# Patient Record
Sex: Female | Born: 1945 | Race: White | Hispanic: No | Marital: Married | Smoking: Former smoker
Health system: Southern US, Community
[De-identification: ages and names within clinical notes are randomized; demographics above are authoritative.]

## PROBLEM LIST (undated history)

## (undated) DIAGNOSIS — F419 Anxiety disorder, unspecified: Secondary | ICD-10-CM

## (undated) DIAGNOSIS — Z87898 Personal history of other specified conditions: Secondary | ICD-10-CM

## (undated) DIAGNOSIS — I10 Essential (primary) hypertension: Secondary | ICD-10-CM

## (undated) DIAGNOSIS — K589 Irritable bowel syndrome without diarrhea: Secondary | ICD-10-CM

## (undated) DIAGNOSIS — R351 Nocturia: Secondary | ICD-10-CM

## (undated) DIAGNOSIS — M1990 Unspecified osteoarthritis, unspecified site: Secondary | ICD-10-CM

## (undated) DIAGNOSIS — Z8719 Personal history of other diseases of the digestive system: Secondary | ICD-10-CM

## (undated) DIAGNOSIS — F32A Depression, unspecified: Secondary | ICD-10-CM

## (undated) DIAGNOSIS — Z8679 Personal history of other diseases of the circulatory system: Secondary | ICD-10-CM

## (undated) DIAGNOSIS — M545 Low back pain, unspecified: Secondary | ICD-10-CM

## (undated) DIAGNOSIS — R251 Tremor, unspecified: Secondary | ICD-10-CM

## (undated) DIAGNOSIS — E039 Hypothyroidism, unspecified: Secondary | ICD-10-CM

## (undated) DIAGNOSIS — K219 Gastro-esophageal reflux disease without esophagitis: Secondary | ICD-10-CM

## (undated) DIAGNOSIS — F411 Generalized anxiety disorder: Secondary | ICD-10-CM

## (undated) DIAGNOSIS — M797 Fibromyalgia: Secondary | ICD-10-CM

## (undated) DIAGNOSIS — IMO0002 Reserved for concepts with insufficient information to code with codable children: Secondary | ICD-10-CM

## (undated) DIAGNOSIS — G250 Essential tremor: Secondary | ICD-10-CM

## (undated) DIAGNOSIS — T8859XA Other complications of anesthesia, initial encounter: Secondary | ICD-10-CM

## (undated) DIAGNOSIS — G8929 Other chronic pain: Secondary | ICD-10-CM

## (undated) DIAGNOSIS — E785 Hyperlipidemia, unspecified: Secondary | ICD-10-CM

## (undated) DIAGNOSIS — E559 Vitamin D deficiency, unspecified: Secondary | ICD-10-CM

## (undated) DIAGNOSIS — G4700 Insomnia, unspecified: Secondary | ICD-10-CM

## (undated) DIAGNOSIS — R350 Frequency of micturition: Secondary | ICD-10-CM

## (undated) DIAGNOSIS — F329 Major depressive disorder, single episode, unspecified: Secondary | ICD-10-CM

## (undated) DIAGNOSIS — E041 Nontoxic single thyroid nodule: Secondary | ICD-10-CM

## (undated) DIAGNOSIS — T4145XA Adverse effect of unspecified anesthetic, initial encounter: Secondary | ICD-10-CM

## (undated) HISTORY — DX: Tremor, unspecified: R25.1

## (undated) HISTORY — DX: Irritable bowel syndrome, unspecified: K58.9

## (undated) HISTORY — DX: Low back pain, unspecified: M54.50

## (undated) HISTORY — DX: Generalized anxiety disorder: F41.1

## (undated) HISTORY — PX: HEMORROIDECTOMY: SUR656

## (undated) HISTORY — PX: OTHER SURGICAL HISTORY: SHX169

## (undated) HISTORY — PX: CARDIOVASCULAR STRESS TEST: SHX262

## (undated) HISTORY — DX: Hyperlipidemia, unspecified: E78.5

## (undated) HISTORY — DX: Essential (primary) hypertension: I10

## (undated) HISTORY — DX: Personal history of other specified conditions: Z87.898

## (undated) HISTORY — DX: Major depressive disorder, single episode, unspecified: F32.9

## (undated) HISTORY — DX: Reserved for concepts with insufficient information to code with codable children: IMO0002

## (undated) HISTORY — PX: VAGINAL HYSTERECTOMY: SUR661

## (undated) HISTORY — DX: Gastro-esophageal reflux disease without esophagitis: K21.9

## (undated) HISTORY — DX: Unspecified osteoarthritis, unspecified site: M19.90

## (undated) HISTORY — DX: Other chronic pain: G89.29

## (undated) HISTORY — DX: Anxiety disorder, unspecified: F41.9

## (undated) HISTORY — DX: Depression, unspecified: F32.A

## (undated) HISTORY — DX: Hypothyroidism, unspecified: E03.9

## (undated) HISTORY — PX: CARDIAC CATHETERIZATION: SHX172

## (undated) HISTORY — DX: Vitamin D deficiency, unspecified: E55.9

## (undated) HISTORY — PX: TONSILLECTOMY AND ADENOIDECTOMY: SUR1326

---

## 1991-05-23 HISTORY — PX: SIGMOID RESECTION / RECTOPEXY: SUR1294

## 1998-01-26 ENCOUNTER — Ambulatory Visit (HOSPITAL_COMMUNITY): Admission: RE | Admit: 1998-01-26 | Discharge: 1998-01-26 | Payer: Self-pay | Admitting: Gastroenterology

## 1998-04-09 ENCOUNTER — Encounter: Payer: Self-pay | Admitting: Urology

## 1998-04-13 ENCOUNTER — Ambulatory Visit (HOSPITAL_COMMUNITY): Admission: RE | Admit: 1998-04-13 | Discharge: 1998-04-13 | Payer: Self-pay | Admitting: Urology

## 2000-06-27 ENCOUNTER — Encounter: Admission: RE | Admit: 2000-06-27 | Discharge: 2000-06-27 | Payer: Self-pay | Admitting: Family Medicine

## 2000-06-27 ENCOUNTER — Encounter: Payer: Self-pay | Admitting: Family Medicine

## 2000-07-25 ENCOUNTER — Encounter: Admission: RE | Admit: 2000-07-25 | Discharge: 2000-07-25 | Payer: Self-pay | Admitting: Family Medicine

## 2000-07-25 ENCOUNTER — Encounter: Payer: Self-pay | Admitting: Family Medicine

## 2000-10-13 ENCOUNTER — Encounter: Payer: Self-pay | Admitting: Emergency Medicine

## 2000-10-13 ENCOUNTER — Inpatient Hospital Stay (HOSPITAL_COMMUNITY): Admission: EM | Admit: 2000-10-13 | Discharge: 2000-10-15 | Payer: Self-pay | Admitting: Emergency Medicine

## 2000-10-13 ENCOUNTER — Encounter: Payer: Self-pay | Admitting: General Surgery

## 2001-01-28 ENCOUNTER — Encounter: Payer: Self-pay | Admitting: Emergency Medicine

## 2001-01-29 ENCOUNTER — Inpatient Hospital Stay (HOSPITAL_COMMUNITY): Admission: EM | Admit: 2001-01-29 | Discharge: 2001-01-31 | Payer: Self-pay | Admitting: Emergency Medicine

## 2001-01-29 ENCOUNTER — Encounter: Payer: Self-pay | Admitting: Family Medicine

## 2002-02-11 ENCOUNTER — Encounter: Admission: RE | Admit: 2002-02-11 | Discharge: 2002-02-11 | Payer: Self-pay | Admitting: Family Medicine

## 2002-02-11 ENCOUNTER — Encounter: Payer: Self-pay | Admitting: Family Medicine

## 2002-05-22 HISTORY — PX: CHOLECYSTECTOMY: SHX55

## 2002-05-22 HISTORY — PX: BILATERAL SALPINGOOPHORECTOMY: SHX1223

## 2002-09-23 ENCOUNTER — Encounter: Payer: Self-pay | Admitting: Urology

## 2002-09-25 ENCOUNTER — Ambulatory Visit (HOSPITAL_COMMUNITY): Admission: RE | Admit: 2002-09-25 | Discharge: 2002-09-25 | Payer: Self-pay | Admitting: Urology

## 2004-02-17 ENCOUNTER — Ambulatory Visit: Payer: Self-pay | Admitting: Internal Medicine

## 2004-06-29 ENCOUNTER — Emergency Department (HOSPITAL_COMMUNITY): Admission: EM | Admit: 2004-06-29 | Discharge: 2004-06-29 | Payer: Self-pay | Admitting: Emergency Medicine

## 2004-10-04 ENCOUNTER — Emergency Department (HOSPITAL_COMMUNITY): Admission: EM | Admit: 2004-10-04 | Discharge: 2004-10-04 | Payer: Self-pay | Admitting: Emergency Medicine

## 2004-10-05 ENCOUNTER — Ambulatory Visit: Payer: Self-pay | Admitting: Psychiatry

## 2004-10-05 ENCOUNTER — Inpatient Hospital Stay (HOSPITAL_COMMUNITY): Admission: EM | Admit: 2004-10-05 | Discharge: 2004-10-07 | Payer: Self-pay | Admitting: Psychiatry

## 2010-06-11 ENCOUNTER — Encounter: Payer: Self-pay | Admitting: Family Medicine

## 2010-06-12 ENCOUNTER — Encounter: Payer: Self-pay | Admitting: Family Medicine

## 2010-10-05 HISTORY — PX: ESOPHAGOGASTRODUODENOSCOPY: SHX1529

## 2010-10-20 ENCOUNTER — Telehealth: Payer: Self-pay | Admitting: Cardiology

## 2010-10-20 ENCOUNTER — Encounter: Payer: Self-pay | Admitting: Cardiology

## 2010-10-21 ENCOUNTER — Encounter: Payer: Self-pay | Admitting: Cardiology

## 2010-10-21 ENCOUNTER — Ambulatory Visit (INDEPENDENT_AMBULATORY_CARE_PROVIDER_SITE_OTHER): Payer: Managed Care, Other (non HMO) | Admitting: Cardiology

## 2010-10-21 ENCOUNTER — Ambulatory Visit: Payer: Self-pay | Admitting: Cardiology

## 2010-10-21 DIAGNOSIS — E785 Hyperlipidemia, unspecified: Secondary | ICD-10-CM | POA: Insufficient documentation

## 2010-10-21 DIAGNOSIS — K219 Gastro-esophageal reflux disease without esophagitis: Secondary | ICD-10-CM

## 2010-10-21 DIAGNOSIS — I739 Peripheral vascular disease, unspecified: Secondary | ICD-10-CM

## 2010-10-21 DIAGNOSIS — Z720 Tobacco use: Secondary | ICD-10-CM

## 2010-10-21 DIAGNOSIS — R079 Chest pain, unspecified: Secondary | ICD-10-CM | POA: Insufficient documentation

## 2010-10-21 DIAGNOSIS — I509 Heart failure, unspecified: Secondary | ICD-10-CM

## 2010-10-21 DIAGNOSIS — I10 Essential (primary) hypertension: Secondary | ICD-10-CM | POA: Insufficient documentation

## 2010-10-21 NOTE — Progress Notes (Signed)
Julie Ballard Date of Birth: 16-Nov-1945   History of Present Illness: Julie Ballard is a pleasant 65 year old white female who is seen at the request of Dr. Andrey Campanile for evaluation of epigastric pain. She complains primarily of pain in the epigastric and lower sternal region. This may be accompanied with a knifelike sensation underneath her left breast that radiates into her back. Sometimes she gets very sick with this and feels like she's going to pass out. At other times she has a pulling sensation that radiates into her right abdomen down to her right groin. If she is in a sitting position she has to straighten up for the symptoms to resolve. If she is standing she states the symptoms are so bad that she almost has to go down on her knees. She has had cardiac evaluation in the distant past. In 1990 she had several stress tests which were unremarkable. She did have a heart catheterization in 2001 at [facility] and reports that her coronaries were normal. She also apparently had congestive heart failure in early 2000 and a time when her blood pressure was severely elevated. Her echocardiogram was apparently unremarkable. She does admit that her blood pressure hasn't been optimal control. Currently she is only on in Inderal and HCTZ for this.  Current Outpatient Prescriptions on File Prior to Visit  Medication Sig Dispense Refill  . aspirin 81 MG tablet Take 81 mg by mouth daily.        Marland Kitchen BLACK COHOSH PO Take by mouth daily.        . chlorpheniramine (CHLOR-TRIMETON) 4 MG tablet Take 4 mg by mouth as needed.        . cyclobenzaprine (FLEXERIL) 10 MG tablet Take 10 mg by mouth 3 (three) times daily as needed.        . dicyclomine (BENTYL) 10 MG capsule Take 10 mg by mouth 4 (four) times daily -  before meals and at bedtime.        . ergocalciferol (VITAMIN D2) 50000 UNITS capsule Take 50,000 Units by mouth once a week.        . estradiol (ESTRACE) 1 MG tablet Take 1 mg by mouth daily.        .  hydrochlorothiazide 25 MG tablet Take 25 mg by mouth daily.        Marland Kitchen ibuprofen (ADVIL,MOTRIN) 200 MG tablet Take 600 mg by mouth as needed.        Marland Kitchen LORazepam (ATIVAN) 1 MG tablet Take 1 mg by mouth every 8 (eight) hours.        . niacin 500 MG tablet Take 500 mg by mouth daily with breakfast.        . omeprazole (PRILOSEC) 40 MG capsule Take 20 mg by mouth daily.       . phenylephrine (SUDAFED PE) 10 MG TABS Take 10 mg by mouth as needed.        . potassium chloride (KLOR-CON) 10 MEQ CR tablet Take 20 mEq by mouth daily.       . pravastatin (PRAVACHOL) 80 MG tablet Take 80 mg by mouth daily.        . promethazine (PHENERGAN) 25 MG tablet Take 25 mg by mouth every 6 (six) hours as needed.        . propranolol (INDERAL) 40 MG tablet Take 40 mg by mouth 3 (three) times daily.        Marland Kitchen pyridOXINE (VITAMIN B-6) 100 MG tablet Take 100 mg by mouth daily.        Marland Kitchen  vitamin B-12 (CYANOCOBALAMIN) 1000 MCG tablet Take 1,000 mcg by mouth daily.        Marland Kitchen zolpidem (AMBIEN) 10 MG tablet Take 10 mg by mouth at bedtime as needed.        . meperidine (DEMEROL) 50 MG tablet Take 50 mg by mouth every 4 (four) hours as needed.          Allergies  Allergen Reactions  . Codeine   . Cymbalta (Duloxetine Hcl)   . Doxycycline   . Elavil (Amitriptyline Hcl)   . Erythromycin   . Morphine And Related   . Mysoline (Primidone)   . Penicillins   . Sulfa Drugs Cross Reactors   . Zofran     Past Medical History  Diagnosis Date  . DJD (degenerative joint disease)   . GERD (gastroesophageal reflux disease)   . IBS (irritable bowel syndrome)   . History of fibromyalgia     SEVERE  . Depression   . Hypertension   . Syncope and collapse   . DDD (degenerative disc disease)   . Tremor     SEVERE  . CHF (congestive heart failure)   . Hyperlipidemia   . Diverticulitis, colon     Past Surgical History  Procedure Date  . Esophagogastroduodenoscopy 10/05/10    NORMAL  . Colonoscopy   . Cardiac catheterization  09/2000  . Transthoracic echocardiogram   . Cholecystectomy   . Sigmoid resection / rectopexy   . Hemorroidectomy   . Ovary surgery   . Tonsillectomy and adenoidectomy     History  Smoking status  . Current Everyday Smoker -- 0.5 packs/day for 18 years  . Types: Cigarettes  Smokeless tobacco  . Never Used    History  Alcohol Use No    Family History  Problem Relation Age of Onset  . Heart attack Father   . Alcohol abuse Sister   . Cirrhosis Sister   . Heart attack Brother     Review of Systems: The review of systems is positive for numbness in her hands and feet.  Her toes and feet turn blue at times. She continues to smoke one half pack per day.she has had recent extensive GI evaluation by Dr. Chales Abrahams including upper and lower endoscopy with biopsies and CT of the abdomen.All other systems were reviewed and are negative.  Physical Exam: BP 160/88  Pulse 64  Ht 5\' 7"  (1.702 m)  Wt 184 lb 6 oz (83.632 kg)  BMI 28.88 kg/m2 She is a pleasant overweight white female in no acute distress. She is normocephalic, atraumatic. Pupils are equal round and reactive to light and accommodation. Sclera are clear. Oropharynx is clear. Neck is supple without JVD, adenopathy, thyromegaly, or bruits. Lungs are clear. Cardiac exam reveals a regular rate and rhythm without gallop, murmur, or click. Abdomen is soft and nontender. There is no mass or hepatosplenomegaly. Bowel sounds are positive. Femoral and popliteal pulses and posterior tibial pulses are 2+ and symmetric. Dorsalis pedis pulses are reduced. Skin is warm and dry. She is alert and oriented x3. Cranial nerves II through XII are intact. LABORATORY DATA: ECG demonstrates normal sinus rhythm with a normal ECG.  Assessment / Plan:

## 2010-10-21 NOTE — Assessment & Plan Note (Addendum)
Her blood pressure is not optimally controlled. Depending on the result of her other cardiac evaluation it may be worthwhile considering an alternative antihypertensive drugs besides Inderal. I think carvedilol or Bystolic would be more efficacious.

## 2010-10-21 NOTE — Assessment & Plan Note (Signed)
Her symptoms are atypical for cardiac pain . However she does have multiple cardiac risk factors. We will schedule her for a nuclear stress test. We will also schedule her for lower extremity arterial Doppler studies to rule out peripheral arterial disease.

## 2010-10-21 NOTE — Assessment & Plan Note (Signed)
I have encouraged her to quit smoking. She is interested in stopping but hasn't picked a quit date yet.

## 2010-10-21 NOTE — Assessment & Plan Note (Signed)
Her prior history is suggestive of hypertensive urgency. She has had no congestive heart failure since that initial incident. Her blood pressure was severely elevated at that time.

## 2010-10-21 NOTE — Patient Instructions (Signed)
We will schedule you for a nuclear stress test and lower extremity arterial dopplers.  I would encourage your efforts at smoking cessation.  We could consider an alternative beta blocker for better blood pressure control.

## 2010-10-24 ENCOUNTER — Other Ambulatory Visit: Payer: Self-pay | Admitting: Cardiology

## 2010-11-01 ENCOUNTER — Encounter: Payer: Managed Care, Other (non HMO) | Admitting: *Deleted

## 2010-11-01 ENCOUNTER — Other Ambulatory Visit (HOSPITAL_COMMUNITY): Payer: Managed Care, Other (non HMO) | Admitting: Radiology

## 2010-11-22 ENCOUNTER — Other Ambulatory Visit (HOSPITAL_COMMUNITY): Payer: Managed Care, Other (non HMO) | Admitting: Radiology

## 2010-11-22 ENCOUNTER — Encounter: Payer: Managed Care, Other (non HMO) | Admitting: *Deleted

## 2010-12-13 ENCOUNTER — Encounter: Payer: Managed Care, Other (non HMO) | Admitting: *Deleted

## 2010-12-13 ENCOUNTER — Other Ambulatory Visit (HOSPITAL_COMMUNITY): Payer: Managed Care, Other (non HMO) | Admitting: Radiology

## 2010-12-22 ENCOUNTER — Encounter (INDEPENDENT_AMBULATORY_CARE_PROVIDER_SITE_OTHER): Payer: Managed Care, Other (non HMO) | Admitting: *Deleted

## 2010-12-22 ENCOUNTER — Ambulatory Visit (HOSPITAL_COMMUNITY): Payer: Managed Care, Other (non HMO) | Attending: Cardiology | Admitting: Radiology

## 2010-12-22 DIAGNOSIS — R55 Syncope and collapse: Secondary | ICD-10-CM

## 2010-12-22 DIAGNOSIS — I739 Peripheral vascular disease, unspecified: Secondary | ICD-10-CM

## 2010-12-22 DIAGNOSIS — R079 Chest pain, unspecified: Secondary | ICD-10-CM | POA: Insufficient documentation

## 2010-12-22 DIAGNOSIS — R0789 Other chest pain: Secondary | ICD-10-CM

## 2010-12-22 MED ORDER — TECHNETIUM TC 99M TETROFOSMIN IV KIT
33.0000 | PACK | Freq: Once | INTRAVENOUS | Status: AC | PRN
  Administered 2010-12-22: 33 via INTRAVENOUS

## 2010-12-22 MED ORDER — TECHNETIUM TC 99M TETROFOSMIN IV KIT
10.5000 | PACK | Freq: Once | INTRAVENOUS | Status: AC | PRN
  Administered 2010-12-22: 11 via INTRAVENOUS

## 2010-12-22 MED ORDER — REGADENOSON 0.4 MG/5ML IV SOLN
0.4000 mg | Freq: Once | INTRAVENOUS | Status: AC
  Administered 2010-12-22: 0.4 mg via INTRAVENOUS

## 2010-12-22 NOTE — Progress Notes (Signed)
[facility] SITE 3 NUCLEAR MED [address] [place] [address] [phone]  Cardiology Nuclear Med Study  Julie Ballard is a 65 y.o. female [id number] 10-17-1945   Nuclear Med Background Indication for Stress Test:  Evaluation for Ischemia History:  '90 MPS:OK per patient; '02 Cath [facility]):OK per patient; h/o CHF Cardiac Risk Factors: Claudication, Family History - CAD, Hypertension, Lipids and Smoker  Symptoms:  Chest Pain/Tightness with and without Exertion (last episode of chest discomfort was yesterday), Diaphoresis, Dizziness, Nausea/Vomiting, Near Syncope with Chest Pain and Palpitations/Rapid HR    Nuclear Pre-Procedure Caffeine/Decaff Intake:  None NPO After: 10:00pm   Lungs:  Clear.  O2 sat 99% on RA. IV 0.9% NS with Angio Cath:  20g  IV Site: R Wrist  IV Started by:  Stanton Kidney, EMT-P  Chest Size (in):  40 Cup Size: D  Height: 5\' 7"  (1.702 m)  Weight:  179 lb (81.194 kg)  BMI:  Body mass index is 28.04 kg/(m^2). Tech Comments:  Inderal held today, per patient.    Nuclear Med Study 1 or 2 day study: 1 day  Stress Test Type:  Treadmill/Lexiscan  Reading MD: Olga Millers, MD  Order Authorizing Provider:  Peter Swaziland, MD  Resting Radionuclide: Technetium 103m Tetrofosmin  Resting Radionuclide Dose: 10.5 mCi   Stress Radionuclide:  Technetium 56m Tetrofosmin  Stress Radionuclide Dose: 33.0 mCi           Stress Protocol Rest HR: 64 Stress HR: 103  Rest BP: 134/91 Stress BP: 183/102  Exercise Time (min): 2:00 METS: n/a   Predicted Max HR: 157 bpm % Max HR: 65.61 bpm Rate Pressure Product: 21308   Dose of Adenosine (mg):  n/a Dose of Lexiscan: 0.4 mg  Dose of Atropine (mg): n/a Dose of Dobutamine: n/a mcg/kg/min (at max HR)  Stress Test Technologist: Smiley Houseman, CMA-N  Nuclear Technologist:  Doyne Keel, CNMT     Rest Procedure:  Myocardial perfusion imaging was performed at rest 45 minutes following the intravenous  administration of Technetium 31m Tetrofosmin.  Rest ECG: No acute changes.  Stress Procedure:  The patient received IV Lexiscan 0.4 mg over 15-seconds while walking low level on the treadmill.  Technetium 54m Tetrofosmin injected at 30-seconds while the patient continued to walk for one minute.  There were no significant changes with Lexiscan, rare PAC.  There was a mild hypertensive response to infusion, 183/102.  Quantitative spect images were obtained after a 45 minute delay.  Stress ECG: No significant change from baseline ECG  QPS Raw Data Images:  Acquisition technically good; normal left ventricular size. Stress Images:  Normal homogeneous uptake in all areas of the myocardium. Rest Images:  Normal homogeneous uptake in all areas of the myocardium. Subtraction (SDS):  No evidence of ischemia. Transient Ischemic Dilatation (Normal <1.22):  0.97 Lung/Heart Ratio (Normal <0.45):  0.34  Quantitative Gated Spect Images QGS EDV:  49 ml QGS ESV:  9 ml QGS cine images:  NL LV Function; NL Wall Motion QGS EF: 81%  Impression Exercise Capacity:  Lexiscan with low level exercise. BP Response:  Normal blood pressure response. Clinical Symptoms:  No chest pain. ECG Impression:  No significant ST segment change suggestive of ischemia. Comparison with Prior Nuclear Study: No images to compare  Overall Impression:  Normal stress nuclear study.  Olga Millers

## 2010-12-23 ENCOUNTER — Encounter: Payer: Self-pay | Admitting: Cardiology

## 2010-12-23 NOTE — Progress Notes (Signed)
Nuclear stress is normal with normal EF. Please report. Julie Ballard

## 2010-12-23 NOTE — Progress Notes (Signed)
Lm w/ results of stress test. Will send to Dr. Benedetto Goad

## 2010-12-23 NOTE — Progress Notes (Signed)
Nuclear report routed to Dr. Swaziland. Julie Ballard, Julie Ballard

## 2010-12-26 ENCOUNTER — Encounter: Payer: Self-pay | Admitting: Cardiology

## 2010-12-27 ENCOUNTER — Telehealth: Payer: Self-pay | Admitting: *Deleted

## 2010-12-27 NOTE — Telephone Encounter (Signed)
Message copied by Lorayne Bender on Tue Dec 27, 2010  9:50 AM ------      Message from: Swaziland, PETER M      Created: Mon Dec 26, 2010 12:36 PM       Lower extremity arterial dopplers are normal.

## 2010-12-27 NOTE — Telephone Encounter (Signed)
Lm w/doppler results. Will send copy to Dr. Benedetto Goad.

## 2010-12-27 NOTE — Telephone Encounter (Signed)
Message copied by Lorayne Bender on Tue Dec 27, 2010  9:48 AM ------      Message from: Sw[place], PETER M      Created: Mon Dec 26, 2010 12:36 PM       Lower extremity arterial dopplers are normal.

## 2012-09-05 ENCOUNTER — Other Ambulatory Visit: Payer: Self-pay | Admitting: Urology

## 2012-09-05 MED ORDER — PHENAZOPYRIDINE HCL 200 MG PO TABS: Freq: Once | ORAL | Status: AC

## 2012-09-10 ENCOUNTER — Telehealth: Payer: Self-pay | Admitting: Cardiology

## 2012-09-10 NOTE — Telephone Encounter (Signed)
No problem stopping ASA from my standpoint for procedure.  Riad Wagley [place] MD, [facility]

## 2012-09-10 NOTE — Telephone Encounter (Signed)
Spoke to [employer] from [facility] patient scheduled for cysto 09/17/12,wants to know if ok for patient to hold aspirin.Message sent to Dr.Jordan.

## 2012-09-10 NOTE — Telephone Encounter (Signed)
New problem    Julie Ballard/[facility] calling to check on a cardiac clearance that was fax on 09/05/12 and hasn't had any response . Pt sx is 09/17/12. Please call Julie Ballard

## 2012-09-12 NOTE — Telephone Encounter (Signed)
Returned call to [employer] at [facility] no answer.Left message on her personal voice mail Dr.Jordan advised ok to hold aspirin for upcoming procedure.

## 2012-09-16 ENCOUNTER — Encounter (HOSPITAL_BASED_OUTPATIENT_CLINIC_OR_DEPARTMENT_OTHER): Payer: Self-pay | Admitting: *Deleted

## 2012-09-16 NOTE — H&P (Signed)
ctive Problems Problems  1. Chronic Interstitial Cystitis 595.1 2. Nocturia 788.43 3. Urinary Frequency 788.41  History of Present Illness  Julie Ballard is a 67 yo WF sent back in consultation by Dr. Andrey Campanile for increased bladder symptoms.  She was doing well until January and had intercourse with her husband and since then she has had progressive bladder pain and constant pressure with a sensation of incomplete emptying.  She voids about 20x daily.   She has nocturia x 3+.   She has no dysuria.  She has occasional incontinence with stress and urge.  She has had no hematuria.  She was given some Detrol which has helped just a little.   She has a history of IC and had an HOD in 2004 and in 1999.   She had only been sexually active about every 3 months and didn't have problems the last 2 times.   Past Medical History Problems  1. History of  Adult Sleep Apnea 780.57 2. History of  Anxiety (Symptom) 300.00 3. History of  Arthritis V13.4 4. History of  Cardiac Catheterization  (Diagnostic) 5. History of  Cervical Cancer V10.41 6. History of  Chronic Pain 338.29 7. History of  Congestive Heart Failure 428.0 8. History of  Esophageal Reflux 530.81 9. History of  Fibromyalgia 729.1 10. History of  Hypercholesterolemia 272.0 11. History of  Hypertension 401.9 12. History of  Irritable Bowel Syndrome 564.1 13. History of  Lumbosacral Disc Degeneration 722.52 14. History of  Ovarian Cyst 620.2 15. History of  Rhythm Disorder 427.9 16. History of  Seizure  Surgical History Problems  1. History of  Cholecystectomy 2. History of  Colon Surgery 3. History of  Cystoscopy (For Therapy) 4. History of  Hemorrhoidectomy 5. History of  Hysterectomy V45.77 6. History of  Tonsillectomy   G4P2M2  NVD2   Current Meds 1. Aspirin 81 MG Oral Tablet; Therapy: (Recorded:16Apr2014) to 2. Chlorpheniramine Maleate 4 MG Oral Tablet; Therapy: (Recorded:16Apr2014) to 3. Cyclobenzaprine HCl 10 MG Oral Tablet;  Therapy: (Recorded:16Apr2014) to 4. Dicyclomine HCl 10 MG Oral Capsule; Therapy: 20Oct2013 to 5. Estradiol 1 MG Oral Tablet; Therapy: (Recorded:16Apr2014) to 6. Gabapentin 100 MG Oral Capsule; Therapy: 06Feb2014 to 7. Hydrochlorothiazide 25 MG Oral Tablet; Therapy: (Recorded:16Apr2014) to 8. Ibuprofen TABS; Therapy: (Recorded:16Apr2014) to 9. Klor-Con M20 20 MEQ Oral Tablet Extended Release; Therapy: 29Nov2013 to 10. Levothyroxine Sodium 50 MCG Oral Tablet; Therapy: 21Dec2013 to 11. LORazepam 1 MG Oral Tablet; Therapy: 29Nov2013 to 12. Melatonin 5 MG Oral Capsule; Therapy: (Recorded:16Apr2014) to 13. Meperidine HCl 50 MG Oral Tablet; Therapy: 12Dec2013 to 14. Niacin TABS; Therapy: (Recorded:16Apr2014) to 15. Nitrostat 0.4 MG Sublingual Tablet Sublingual; Therapy: 30Oct2013 to 16. Omeprazole 20 MG Oral Capsule Delayed Release; Therapy: 23Oct2013 to 17. Promethazine HCl 25 MG Oral Tablet; Therapy: 20Oct2013 to 18. Propranolol HCl 40 MG Oral Tablet; Therapy: 20Oct2013 to 19. Simvastatin 40 MG Oral Tablet; Therapy: 20Oct2013 to 20. Tolterodine Tartrate ER 4 MG Oral Capsule Extended Release 24 Hour; Therapy: 24Mar2014 to 21. Vitamin B-12 TABS; Therapy: (Recorded:16Apr2014) to 22. Vitamin B-6 TABS; Therapy: (Recorded:16Apr2014) to 23. Vitamin D (Ergocalciferol) 50000 UNIT Oral Capsule; Therapy: 04Dec2013 to 24. Zolpidem Tartrate 10 MG Oral Tablet; Therapy: 25Oct2013 to  Allergies Medication  1. Lipitor TABS 2. Pravachol TABS 3. Symbyax CAPS 4. Abilify TABS 5. Codeine Derivatives 6. Cymbalta CPEP 7. Doxycycline Hyclate TABS 8. Elavil TABS 9. Erythromycin Base TABS 10. Halcion TABS 11. Morphine Derivatives 12. Mysoline TABS 13. Penicillins 14. Sulfa Drugs 15. Vytorin TABS 16.  Zofran TABS  Family History Problems  1. Family history of  Brain Tumor 2. Maternal history of  Breast Cancer V16.3 3. Family history of  Breast Cancer V16.3 4. Daughter's history of  Cervical Cancer 5.  Family history of  Death In The Family Father 6. Family history of  Death In The Family Mother 7. Family history of  Family Health Status Number Of Children 8. Family history of  Heart Disease V17.49 9. Maternal history of  Kidney Cancer V16.51 10. Paternal history of  Nephrolithiasis 11. Daughter's history of  Nephrolithiasis 12. Family history of  Prostate Cancer V16.42 13. Family history of  Renal Failure 14. Family history of  Tuberculosis  Social History Problems    Caffeine Use   Marital History - Currently Married   Occupation:   Tobacco Use 305.1 Denied    History of  Alcohol Use  Review of Systems Genitourinary, constitutional, skin, eye, otolaryngeal, hematologic/lymphatic, cardiovascular, pulmonary, endocrine, musculoskeletal, gastrointestinal, neurological and psychiatric system(s) were reviewed and pertinent findings if present are noted.  Genitourinary: urinary frequency, urinary urgency, dysuria, nocturia, incontinence, urinary hesitancy, urinary stream starts and stops, incomplete emptying of bladder, dyspareunia and initiating urination requires straining.  Gastrointestinal: nausea, heartburn, diarrhea and constipation.  ENT: sore throat and sinus problems.  Endocrine: polydipsia.  Musculoskeletal: back pain.  Neurological: headache.  Psychiatric: anxiety.    Vitals Vital Signs [Data Includes: Last 1 Day]  16Apr2014 03:25PM  BMI Calculated: 24.11 BSA Calculated: 1.77 Height: 5 ft 6 in Weight: 150 lb  Blood Pressure: 131 / 82 Temperature: 97.8 F Heart Rate: 69  Physical Exam Constitutional: Well nourished and well developed . No acute distress.  ENT:. The ears and nose are normal in appearance.  Neck: The appearance of the neck is normal and no neck mass is present.  Pulmonary: No respiratory distress and normal respiratory rhythm and effort.  Cardiovascular: Heart rate and rhythm are normal . No peripheral edema.  Abdomen: The abdomen is soft and  nontender (except as noted). No masses are palpated. Mild suprapubic tenderness is present. No CVA tenderness. No hernias are palpable. No hepatosplenomegaly noted.  Genitourinary:  Chaperone Present: Delmar Landau.  Examination of the external genitalia shows normal female external genitalia and no vulvar atrophy. The urethra is normal in appearance. Urethral hypermobility is not present. Vaginal exam demonstrates atrophy and the vaginal epithelium to be poorly estrogenized, but no uterine prolapse. No cystocele is identified. No rectocele is identified. The cervix is is absent. The uterus is absent. The adnexa are palpably normal. The bladder is tender, but normal on palpation and not distended. The anus is normal on inspection. The perineum is normal on inspection.  Lymphatics: The supraclavicular, axillary, femoral and inguinal nodes are not enlarged or tender.  Skin: Normal skin turgor, no visible rash and no visible skin lesions.  Neuro/Psych:. Mood and affect are appropriate. Decreased sensation of the perineum/perianal region (S3,4,5).    Results/Data  Urine [Data Includes: Last 1 Day]   16Apr2014  COLOR YELLOW   APPEARANCE CLEAR   SPECIFIC GRAVITY <1.005   pH 6.0   GLUCOSE NEG mg/dL  BILIRUBIN NEG   KETONE NEG mg/dL  BLOOD NEG   PROTEIN NEG mg/dL  UROBILINOGEN 0.2 mg/dL  NITRITE NEG   LEUKOCYTE ESTERASE NEG    Old records or history reviewed: I have reviewed her recent office noted from Dr. Andrey Campanile with his treatment records. I have reviewed our prior office notes.  PVR: Ultrasound PVR 0 ml.  04 Sep 2012 2:52 PM   UA With REFLEX       COLOR YELLOW       APPEARANCE CLEAR       SPECIFIC GRAVITY <1.005       pH 6.0       GLUCOSE NEG       BILIRUBIN NEG       KETONE NEG       BLOOD NEG       PROTEIN NEG       UROBILINOGEN 0.2       NITRITE NEG       LEUKOCYTE ESTERASE NEG      Assessment Assessed  1. Chronic Interstitial Cystitis 595.1 2. Nocturia 788.43 3. Urinary  Frequency 788.41   She has had a flair of IC that is only partially improved with detrol.   Plan  Chronic Interstitial Cystitis (595.1)  1. Pelvic Exam  Requested for: 16Apr2014 2. PVR U/S  Done: 16Apr2014 3. Follow-up Schedule Surgery Office  Follow-up  Requested for: 16Apr2014   I am going to have her start Estrace cream for the atrophic vaginitis. I have given her samples of Uribel to use qid prn.  I discussed instillations vs HOD and she wants to go ahead with the HOD since it worked so well before.  I reviewed the risks of bleeding, infection, bladder wall injury, anesthetic complications and thrombotic events.   I will have her cleared by Dr. Swaziland because of her history of CHF.     UA With REFLEX  Status: Resulted - Requires Verification  Done: 01Jan0001 12:00AM Ordered Today; For: Health Maintenance (V70.0); Ordered By: Bjorn Pippin  Due: 18Apr2014 Marked Important; Last Updated By: Nathaniel Man   Discussion/Summary  CC: Dr. Benedetto Goad and Dr. Peter Swaziland.

## 2012-09-16 NOTE — Progress Notes (Signed)
NPO AFTER MN. ARRIVES AT 0730. NEEDS ISTAT AND EKG. WILL NOT TAKE MEDS AM OF SURG , INCLUDING NO BETA BLOCKER. PT STATES WHEN HERE [facility]) HEART RATE WENT DOWN TO 20'S AND WAS TOLD NOT TO TAKE AGAIN PRIOR TO SURGERY. REQUESTED ANES. RECORD FROM MED. REC.

## 2012-09-17 ENCOUNTER — Ambulatory Visit (HOSPITAL_BASED_OUTPATIENT_CLINIC_OR_DEPARTMENT_OTHER): Payer: Managed Care, Other (non HMO) | Admitting: Anesthesiology

## 2012-09-17 ENCOUNTER — Ambulatory Visit (HOSPITAL_BASED_OUTPATIENT_CLINIC_OR_DEPARTMENT_OTHER)
Admission: RE | Admit: 2012-09-17 | Discharge: 2012-09-17 | Disposition: A | Discharge status: Home / Self Care | Payer: Managed Care, Other (non HMO) | Source: Ambulatory Visit | Attending: Urology | Admitting: Urology

## 2012-09-17 ENCOUNTER — Encounter (HOSPITAL_BASED_OUTPATIENT_CLINIC_OR_DEPARTMENT_OTHER): Payer: Self-pay | Admitting: *Deleted

## 2012-09-17 ENCOUNTER — Encounter (HOSPITAL_BASED_OUTPATIENT_CLINIC_OR_DEPARTMENT_OTHER)
Admission: RE | Disposition: A | Discharge status: Home / Self Care | Payer: Self-pay | Source: Ambulatory Visit | Attending: Urology

## 2012-09-17 ENCOUNTER — Encounter (HOSPITAL_BASED_OUTPATIENT_CLINIC_OR_DEPARTMENT_OTHER): Payer: Self-pay | Admitting: Anesthesiology

## 2012-09-17 DIAGNOSIS — E7800 Pure hypercholesterolemia, unspecified: Secondary | ICD-10-CM | POA: Insufficient documentation

## 2012-09-17 DIAGNOSIS — Z881 Allergy status to other antibiotic agents status: Secondary | ICD-10-CM | POA: Insufficient documentation

## 2012-09-17 DIAGNOSIS — Z885 Allergy status to narcotic agent status: Secondary | ICD-10-CM | POA: Insufficient documentation

## 2012-09-17 DIAGNOSIS — R351 Nocturia: Secondary | ICD-10-CM | POA: Insufficient documentation

## 2012-09-17 DIAGNOSIS — G4730 Sleep apnea, unspecified: Secondary | ICD-10-CM | POA: Insufficient documentation

## 2012-09-17 DIAGNOSIS — Z882 Allergy status to sulfonamides status: Secondary | ICD-10-CM | POA: Insufficient documentation

## 2012-09-17 DIAGNOSIS — I509 Heart failure, unspecified: Secondary | ICD-10-CM | POA: Insufficient documentation

## 2012-09-17 DIAGNOSIS — IMO0001 Reserved for inherently not codable concepts without codable children: Secondary | ICD-10-CM | POA: Insufficient documentation

## 2012-09-17 DIAGNOSIS — N3010 Interstitial cystitis (chronic) without hematuria: Secondary | ICD-10-CM | POA: Insufficient documentation

## 2012-09-17 DIAGNOSIS — Z8541 Personal history of malignant neoplasm of cervix uteri: Secondary | ICD-10-CM | POA: Insufficient documentation

## 2012-09-17 DIAGNOSIS — M51379 Other intervertebral disc degeneration, lumbosacral region without mention of lumbar back pain or lower extremity pain: Secondary | ICD-10-CM | POA: Insufficient documentation

## 2012-09-17 DIAGNOSIS — I499 Cardiac arrhythmia, unspecified: Secondary | ICD-10-CM | POA: Insufficient documentation

## 2012-09-17 DIAGNOSIS — Z7982 Long term (current) use of aspirin: Secondary | ICD-10-CM | POA: Insufficient documentation

## 2012-09-17 DIAGNOSIS — K589 Irritable bowel syndrome without diarrhea: Secondary | ICD-10-CM | POA: Insufficient documentation

## 2012-09-17 DIAGNOSIS — F411 Generalized anxiety disorder: Secondary | ICD-10-CM | POA: Insufficient documentation

## 2012-09-17 DIAGNOSIS — Z888 Allergy status to other drugs, medicaments and biological substances status: Secondary | ICD-10-CM | POA: Insufficient documentation

## 2012-09-17 DIAGNOSIS — K219 Gastro-esophageal reflux disease without esophagitis: Secondary | ICD-10-CM | POA: Insufficient documentation

## 2012-09-17 DIAGNOSIS — R350 Frequency of micturition: Secondary | ICD-10-CM | POA: Insufficient documentation

## 2012-09-17 DIAGNOSIS — Z880 Allergy status to penicillin: Secondary | ICD-10-CM | POA: Insufficient documentation

## 2012-09-17 DIAGNOSIS — M5137 Other intervertebral disc degeneration, lumbosacral region: Secondary | ICD-10-CM | POA: Insufficient documentation

## 2012-09-17 DIAGNOSIS — Z79899 Other long term (current) drug therapy: Secondary | ICD-10-CM | POA: Insufficient documentation

## 2012-09-17 HISTORY — DX: Frequency of micturition: R35.0

## 2012-09-17 HISTORY — DX: Fibromyalgia: M79.7

## 2012-09-17 HISTORY — DX: Other complications of anesthesia, initial encounter: T88.59XA

## 2012-09-17 HISTORY — DX: Nontoxic single thyroid nodule: E04.1

## 2012-09-17 HISTORY — DX: Adverse effect of unspecified anesthetic, initial encounter: T41.45XA

## 2012-09-17 HISTORY — DX: Insomnia, unspecified: G47.00

## 2012-09-17 HISTORY — DX: Nocturia: R35.1

## 2012-09-17 HISTORY — PX: CYSTO WITH HYDRODISTENSION: SHX5453

## 2012-09-17 HISTORY — DX: Personal history of other specified conditions: Z87.898

## 2012-09-17 HISTORY — DX: Personal history of other diseases of the circulatory system: Z86.79

## 2012-09-17 HISTORY — DX: Personal history of other diseases of the digestive system: Z87.19

## 2012-09-17 HISTORY — DX: Essential tremor: G25.0

## 2012-09-17 LAB — POCT I-STAT 4, (NA,K, GLUC, HGB,HCT)
Potassium: 3.5 mEq/L (ref 3.5–5.1)
Sodium: 139 mEq/L (ref 135–145)

## 2012-09-17 SURGERY — CYSTOSCOPY, WITH BLADDER HYDRODISTENSION
Anesthesia: General | Site: Bladder | Wound class: Clean Contaminated

## 2012-09-17 MED ORDER — CIPROFLOXACIN IN D5W 400 MG/200ML IV SOLN
400.0000 mg | INTRAVENOUS | Status: AC
  Administered 2012-09-17: 400 mg via INTRAVENOUS
  Filled 2012-09-17: qty 200

## 2012-09-17 MED ORDER — ONDANSETRON HCL 4 MG/2ML IJ SOLN
4.0000 mg | Freq: Four times a day (QID) | INTRAMUSCULAR | Status: DC | PRN
  Filled 2012-09-17: qty 2

## 2012-09-17 MED ORDER — LACTATED RINGERS IV SOLN
INTRAVENOUS | Status: DC
  Administered 2012-09-17: 11:00:00 via INTRAVENOUS
  Administered 2012-09-17: 100 mL/h via INTRAVENOUS
  Filled 2012-09-17: qty 1000

## 2012-09-17 MED ORDER — PHENAZOPYRIDINE HCL 200 MG PO TABS: 200.0000 mg | ORAL_TABLET | Freq: Three times a day (TID) | ORAL | Status: AC | PRN

## 2012-09-17 MED ORDER — LACTATED RINGERS IV SOLN
INTRAVENOUS | Status: DC | PRN
  Administered 2012-09-17: 08:00:00 via INTRAVENOUS

## 2012-09-17 MED ORDER — PHENAZOPYRIDINE HCL 200 MG PO TABS
ORAL | Status: DC | PRN
  Administered 2012-09-17: 09:00:00 via INTRAVESICAL

## 2012-09-17 MED ORDER — SODIUM CHLORIDE 0.9 % IJ SOLN
3.0000 mL | Freq: Two times a day (BID) | INTRAMUSCULAR | Status: DC
  Filled 2012-09-17: qty 3

## 2012-09-17 MED ORDER — PROPOFOL 10 MG/ML IV BOLUS
INTRAVENOUS | Status: DC | PRN
  Administered 2012-09-17: 200 mg via INTRAVENOUS

## 2012-09-17 MED ORDER — BUPIVACAINE HCL 0.5 % IJ SOLN
INTRAMUSCULAR | Status: DC | PRN
  Administered 2012-09-17: 15 mL

## 2012-09-17 MED ORDER — SODIUM CHLORIDE 0.9 % IV SOLN
250.0000 mL | INTRAVENOUS | Status: DC | PRN
  Filled 2012-09-17: qty 250

## 2012-09-17 MED ORDER — STERILE WATER FOR IRRIGATION IR SOLN
Status: DC | PRN
  Administered 2012-09-17: 3000 mL

## 2012-09-17 MED ORDER — FENTANYL CITRATE 0.05 MG/ML IJ SOLN
25.0000 ug | INTRAMUSCULAR | Status: DC | PRN
  Filled 2012-09-17: qty 1

## 2012-09-17 MED ORDER — MIDAZOLAM HCL 5 MG/5ML IJ SOLN
INTRAMUSCULAR | Status: DC | PRN
  Administered 2012-09-17 (×2): 1 mg via INTRAVENOUS

## 2012-09-17 MED ORDER — DEXAMETHASONE SODIUM PHOSPHATE 4 MG/ML IJ SOLN
INTRAMUSCULAR | Status: DC | PRN
  Administered 2012-09-17: 10 mg via INTRAVENOUS

## 2012-09-17 MED ORDER — SODIUM CHLORIDE 0.9 % IJ SOLN
3.0000 mL | INTRAMUSCULAR | Status: DC | PRN
  Filled 2012-09-17: qty 3

## 2012-09-17 MED ORDER — LIDOCAINE HCL (CARDIAC) 20 MG/ML IV SOLN
INTRAVENOUS | Status: DC | PRN
  Administered 2012-09-17: 75 mg via INTRAVENOUS

## 2012-09-17 MED ORDER — KETOROLAC TROMETHAMINE 30 MG/ML IJ SOLN
INTRAMUSCULAR | Status: DC | PRN
  Administered 2012-09-17: 30 mg via INTRAVENOUS

## 2012-09-17 MED ORDER — FENTANYL CITRATE 0.05 MG/ML IJ SOLN
INTRAMUSCULAR | Status: DC | PRN
  Administered 2012-09-17: 12.5 ug via INTRAVENOUS
  Administered 2012-09-17: 25 ug via INTRAVENOUS
  Administered 2012-09-17: 12.5 ug via INTRAVENOUS
  Administered 2012-09-17: 50 ug via INTRAVENOUS

## 2012-09-17 MED ORDER — ACETAMINOPHEN 325 MG PO TABS
650.0000 mg | ORAL_TABLET | ORAL | Status: DC | PRN
  Filled 2012-09-17: qty 2

## 2012-09-17 MED ORDER — MEPERIDINE HCL 50 MG/ML IJ SOLN: 50.0000 mg | INTRAMUSCULAR | Status: AC | PRN

## 2012-09-17 MED ORDER — ACETAMINOPHEN 650 MG RE SUPP
650.0000 mg | RECTAL | Status: DC | PRN
  Filled 2012-09-17: qty 1

## 2012-09-17 SURGICAL SUPPLY — 17 items
BAG DRAIN URO-CYSTO SKYTR STRL (DRAIN) ×2 IMPLANT
CANISTER SUCT LVC 12 LTR MEDI- (MISCELLANEOUS) IMPLANT
CATH ROBINSON RED A/P 16FR (CATHETERS) ×2 IMPLANT
CLOTH BEACON ORANGE TIMEOUT ST (SAFETY) ×2 IMPLANT
DRAPE CAMERA CLOSED 9X96 (DRAPES) ×2 IMPLANT
ELECT REM PT RETURN 9FT ADLT (ELECTROSURGICAL) ×2
ELECTRODE REM PT RTRN 9FT ADLT (ELECTROSURGICAL) ×1 IMPLANT
GLOVE BIO SURGEON STRL SZ7 (GLOVE) ×4 IMPLANT
GLOVE INDICATOR 7.0 STRL GRN (GLOVE) ×2 IMPLANT
GLOVE SURG SS PI 8.0 STRL IVOR (GLOVE) ×2 IMPLANT
GOWN STRL REIN XL XLG (GOWN DISPOSABLE) ×2 IMPLANT
NDL SAFETY ECLIPSE 18X1.5 (NEEDLE) ×1 IMPLANT
NEEDLE HYPO 18GX1.5 SHARP (NEEDLE) ×2
NS IRRIG 500ML POUR BTL (IV SOLUTION) IMPLANT
PACK CYSTOSCOPY (CUSTOM PROCEDURE TRAY) ×2 IMPLANT
SYR 30ML LL (SYRINGE) ×2 IMPLANT
WATER STERILE IRR 3000ML UROMA (IV SOLUTION) ×2 IMPLANT

## 2012-09-17 NOTE — Anesthesia Procedure Notes (Signed)
Procedure Name: LMA Insertion Date/Time: 09/17/2012 8:38 AM Performed by: Jessica Priest Pre-anesthesia Checklist: Patient identified, Emergency Drugs available, Suction available and Patient being monitored Patient Re-evaluated:Patient Re-evaluated prior to inductionOxygen Delivery Method: Circle System Utilized Preoxygenation: Pre-oxygenation with 100% oxygen Intubation Type: IV induction Ventilation: Mask ventilation without difficulty LMA: LMA inserted LMA Size: 4.0 Number of attempts: 1 Airway Equipment and Method: bite block Placement Confirmation: positive ETCO2 Tube secured with: Tape Dental Injury: Teeth and Oropharynx as per pre-operative assessment

## 2012-09-17 NOTE — Anesthesia Postprocedure Evaluation (Signed)
  Anesthesia Post-op Note  Patient: Julie Ballard  Procedure(s) Performed: Procedure(s) (LRB): CYSTOSCOPY/HYDRODISTENSION INSTILL MARCAINE & PYRIDIUM (N/A)  Patient Location: PACU  Anesthesia Type: General  Level of Consciousness: awake and alert   Airway and Oxygen Therapy: Patient Spontanous Breathing  Post-op Pain: mild  Post-op Assessment: Post-op Vital signs reviewed, Patient's Cardiovascular Status Stable, Respiratory Function Stable, Patent Airway and No signs of Nausea or vomiting  Last Vitals:  Filed Vitals:   09/17/12 0915  BP: 110/56  Pulse: 77  Temp:   Resp: 13    Post-op Vital Signs: stable   Complications: No apparent anesthesia complications

## 2012-09-17 NOTE — Interval H&P Note (Signed)
History and Physical Interval Note:  09/17/2012 8:10 AM  Julie Ballard  has presented today for surgery, with the diagnosis of Interstitial Cystitis  The various methods of treatment have been discussed with the patient and family. After consideration of risks, benefits and other options for treatment, the patient has consented to  Procedure(s) with comments: CYSTOSCOPY/HYDRODISTENSION INSTILL MARCAINE & PYRIDIUM (N/A) -   INSTILL MARCAINE & PYRIDIUM as a surgical intervention .  The patient's history has been reviewed, patient examined, no change in status, stable for surgery.  I have reviewed the patient's chart and labs.  Questions were answered to the patient's satisfaction.     Claudean Leavelle J

## 2012-09-17 NOTE — Transfer of Care (Signed)
Immediate Anesthesia Transfer of Care Note  Patient: Julie Ballard  Procedure(s) Performed: Procedure(s) (LRB): CYSTOSCOPY/HYDRODISTENSION INSTILL MARCAINE & PYRIDIUM (N/A)  Patient Location: PACU  Anesthesia Type: General  Level of Consciousness: awake, sedated, patient cooperative and responds to stimulation  Airway & Oxygen Therapy: Patient Spontanous Breathing and Patient connected to face mask oxygen  Post-op Assessment: Report given to PACU RN, Post -op Vital signs reviewed and stable and Patient moving all extremities  Post vital signs: Reviewed and stable  Complications: No apparent anesthesia complications

## 2012-09-17 NOTE — Brief Op Note (Signed)
09/17/2012  8:48 AM  PATIENT:  [name]  67 y.o. female  PRE-OPERATIVE DIAGNOSIS:  Interstitial Cystitis  POST-OPERATIVE DIAGNOSIS:  Interstitial Cystitis  PROCEDURE:  Procedure(s) with comments: CYSTOSCOPY/HYDRODISTENSION INSTILL MARCAINE & PYRIDIUM (N/A) -   INSTILL MARCAINE & PYRIDIUM  SURGEON:  Surgeon(s) and Role:    * Anner Crete, MD - Primary  PHYSICIAN ASSISTANT:   ASSISTANTS: none   ANESTHESIA:   general  EBL:     BLOOD ADMINISTERED:none  DRAINS: none   LOCAL MEDICATIONS USED:  MARCAINE     SPECIMEN:  No Specimen  DISPOSITION OF SPECIMEN:  N/A  COUNTS:  YES  TOURNIQUET:  * No tourniquets in log *  DICTATION: .Other Dictation: Dictation Number [id number]  PLAN OF CARE: Discharge to home after PACU  PATIENT DISPOSITION:  PACU - hemodynamically stable.   Delay start of Pharmacological VTE agent (>24hrs) due to surgical blood loss or risk of bleeding: not applicable

## 2012-09-17 NOTE — Anesthesia Preprocedure Evaluation (Addendum)
Anesthesia Evaluation  Patient identified by MRN, date of birth, ID band Patient awake  General Assessment Comment:H/O severe bradycardia in PACU with one anesthetic (HR down to 28). She recovered without event, and this was attributed to her taking Inderal that morning. She did not take inderal today.  Reviewed: Allergy & Precautions, H&P , NPO status , Patient's Chart, lab work & pertinent test results  Airway Mallampati: II TM Distance: >3 FB Neck ROM: Full    Dental  (+) Upper Dentures   Pulmonary  Sleep apnea listed on her history, but she denies this. She does have insomnia. breath sounds clear to auscultation  Pulmonary exam normal       Cardiovascular hypertension, Pt. on medications and Pt. on home beta blockers +CHF + dysrhythmias Atrial Fibrillation Rhythm:Regular Rate:Normal  Last cardiac evaluation by Dr. Swaz[place] in June 2012 reviewed.  Normal stress study 12-22-10  HR sometimes races at night.   Neuro/Psych PSYCHIATRIC DISORDERS Depression  Neuromuscular disease    GI/Hepatic Neg liver ROS, GERD-  Medicated,  Endo/Other  negative endocrine ROS  Renal/GU negative Renal ROS  negative genitourinary   Musculoskeletal  (+) Fibromyalgia -  Abdominal   Peds negative pediatric ROS (+)  Hematology negative hematology ROS (+)   Anesthesia Other Findings   Reproductive/Obstetrics negative OB ROS                        Anesthesia Physical Anesthesia Plan  ASA: III  Anesthesia Plan: General   Post-op Pain Management:    Induction: Intravenous  Airway Management Planned: LMA  Additional Equipment:   Intra-op Plan:   Post-operative Plan: Extubation in OR  Informed Consent: I have reviewed the patients History and Physical, chart, labs and discussed the procedure including the risks, benefits and alternatives for the proposed anesthesia with the patient or authorized representative who  has indicated his/her understanding and acceptance.   Dental advisory given  Plan Discussed with: CRNA  Anesthesia Plan Comments:         Anesthesia Quick Evaluation

## 2012-09-18 ENCOUNTER — Encounter (HOSPITAL_BASED_OUTPATIENT_CLINIC_OR_DEPARTMENT_OTHER): Payer: Self-pay | Admitting: Urology

## 2012-09-18 NOTE — Op Note (Signed)
NAMECHRISTIANA, GUREVICH              ACCOUNT NO.:  [id number]  MEDICAL RECORD NO.:  [id number]  LOCATION:                                 FACILITY:  PHYSICIAN:  Excell Seltzer. Annabell Howells, M.D.    DATE OF BIRTH:  11/11/1945  DATE OF PROCEDURE:  09/17/2012 DATE OF DISCHARGE:                              OPERATIVE REPORT   PROCEDURE:  Cystoscopy with hydrodistention of bladder and installation of Pyridium and Marcaine.  PREOPERATIVE DIAGNOSIS:  Interstitial cystitis.  POSTOPERATIVE DIAGNOSIS:  Interstitial cystitis.  SURGEON:  Excell Seltzer. Annabell Howells, M.D.  ANESTHESIA:  General.  SPECIMENS:  None.  DRAINS:  None.  COMPLICATIONS:  None.  INDICATIONS:  Ms. Marguerita Beards is a 67 year old white female with a history of interstitial cystitis.  She has had recurrent symptoms and has elected hydrodistention.  FINDINGS AND PROCEDURE:  She was taken to the operating room where general anesthetic was induced.  She was given Cipro.  She was placed in lithotomy position and fitted with PAS hose.  Her perineum and genitalia were prepped with Betadine solution.  She was draped in usual sterile fashion.  Cystoscopy was performed using a 22-French scope and 12-degree lens. Examination revealed a normal urethra.  The bladder wall had mild trabeculation, but a normal mucosa without tumor, stones, or inflammation and ureteral orifices were unremarkable.  The bladder was then filled under 80 cm of water pressure to capacity. This was held for 3 minutes, and the bladder was drained to capacity under anesthesia was 700 mL.  Repeat cystoscopy following hydrodistention revealed glomerulations in area of the trigone and posterior wall consistent with interstitial cystitis.  No mucosal tears or ulcers were noted.  At this point, the bladder was drained.  A 16-French red rubber catheter was placed and the bladder was instilled with 30 mL of 0.25% Marcaine with 400 mg crushed Pyridium.  The catheter was removed leaving  the anesthetic solution indwelling.  The patient was taken down from lithotomy position.  Her anesthetic was reversed.  She was moved to recovery room in stable condition.  There were no complications.     Excell Seltzer. Annabell Howells, M.D.     JJW/MEDQ  D:  09/17/2012  T:  09/18/2012  Job:  [id number]

## 2016-04-20 HISTORY — PX: ESOPHAGOGASTRODUODENOSCOPY: SHX1529

## 2016-05-08 HISTORY — PX: COLONOSCOPY: SHX174

## 2016-05-08 LAB — HM COLONOSCOPY

## 2016-08-18 DIAGNOSIS — I4891 Unspecified atrial fibrillation: Secondary | ICD-10-CM | POA: Diagnosis not present

## 2016-08-18 DIAGNOSIS — K589 Irritable bowel syndrome without diarrhea: Secondary | ICD-10-CM | POA: Diagnosis not present

## 2016-08-18 DIAGNOSIS — E785 Hyperlipidemia, unspecified: Secondary | ICD-10-CM | POA: Diagnosis not present

## 2016-08-18 DIAGNOSIS — M797 Fibromyalgia: Secondary | ICD-10-CM | POA: Diagnosis not present

## 2016-08-18 DIAGNOSIS — D751 Secondary polycythemia: Secondary | ICD-10-CM | POA: Diagnosis not present

## 2016-08-18 DIAGNOSIS — E039 Hypothyroidism, unspecified: Secondary | ICD-10-CM | POA: Diagnosis not present

## 2016-08-18 DIAGNOSIS — K219 Gastro-esophageal reflux disease without esophagitis: Secondary | ICD-10-CM | POA: Diagnosis not present

## 2016-08-18 DIAGNOSIS — F419 Anxiety disorder, unspecified: Secondary | ICD-10-CM | POA: Diagnosis not present

## 2016-08-18 DIAGNOSIS — I160 Hypertensive urgency: Secondary | ICD-10-CM | POA: Diagnosis not present

## 2016-08-18 DIAGNOSIS — M810 Age-related osteoporosis without current pathological fracture: Secondary | ICD-10-CM | POA: Diagnosis not present

## 2016-08-18 DIAGNOSIS — N183 Chronic kidney disease, stage 3 (moderate): Secondary | ICD-10-CM | POA: Diagnosis not present

## 2016-08-19 ENCOUNTER — Encounter (HOSPITAL_COMMUNITY): Payer: Self-pay | Admitting: *Deleted

## 2016-08-19 ENCOUNTER — Emergency Department (HOSPITAL_COMMUNITY)
Admission: EM | Admit: 2016-08-19 | Discharge: 2016-08-19 | Disposition: A | Discharge status: Home / Self Care | Payer: 59 | Attending: Emergency Medicine | Admitting: Emergency Medicine

## 2016-08-19 ENCOUNTER — Emergency Department (HOSPITAL_COMMUNITY): Payer: 59

## 2016-08-19 DIAGNOSIS — I10 Essential (primary) hypertension: Secondary | ICD-10-CM

## 2016-08-19 DIAGNOSIS — R072 Precordial pain: Secondary | ICD-10-CM | POA: Diagnosis not present

## 2016-08-19 DIAGNOSIS — Z79899 Other long term (current) drug therapy: Secondary | ICD-10-CM | POA: Diagnosis not present

## 2016-08-19 DIAGNOSIS — Z7982 Long term (current) use of aspirin: Secondary | ICD-10-CM | POA: Diagnosis not present

## 2016-08-19 DIAGNOSIS — I110 Hypertensive heart disease with heart failure: Secondary | ICD-10-CM | POA: Diagnosis present

## 2016-08-19 DIAGNOSIS — E785 Hyperlipidemia, unspecified: Secondary | ICD-10-CM | POA: Diagnosis not present

## 2016-08-19 DIAGNOSIS — K219 Gastro-esophageal reflux disease without esophagitis: Secondary | ICD-10-CM | POA: Diagnosis not present

## 2016-08-19 DIAGNOSIS — F17210 Nicotine dependence, cigarettes, uncomplicated: Secondary | ICD-10-CM | POA: Diagnosis not present

## 2016-08-19 DIAGNOSIS — I509 Heart failure, unspecified: Secondary | ICD-10-CM | POA: Diagnosis not present

## 2016-08-19 DIAGNOSIS — N183 Chronic kidney disease, stage 3 (moderate): Secondary | ICD-10-CM | POA: Diagnosis not present

## 2016-08-19 DIAGNOSIS — I160 Hypertensive urgency: Secondary | ICD-10-CM | POA: Diagnosis not present

## 2016-08-19 LAB — BASIC METABOLIC PANEL
Anion gap: 6 (ref 5–15)
BUN: 7 mg/dL (ref 6–20)
CALCIUM: 10 mg/dL (ref 8.9–10.3)
CO2: 31 mmol/L (ref 22–32)
Chloride: 95 mmol/L — ABNORMAL LOW (ref 101–111)
Creatinine, Ser: 0.94 mg/dL (ref 0.44–1.00)
GFR calc Af Amer: >60 mL/min (ref >60)
Glucose, Bld: 99 mg/dL (ref 65–99)
POTASSIUM: 4 mmol/L (ref 3.5–5.1)
Sodium: 132 mmol/L — ABNORMAL LOW (ref 135–145)

## 2016-08-19 LAB — CBC
HCT: 49.1 % — ABNORMAL HIGH (ref 36.0–46.0)
Hemoglobin: 17.2 g/dL — ABNORMAL HIGH (ref 12.0–15.0)
MCH: 30.5 pg (ref 26.0–34.0)
MCHC: 35 g/dL (ref 30.0–36.0)
MCV: 87.1 fL (ref 78.0–100.0)
PLATELETS: 209 10*3/uL (ref 150–400)
RBC: 5.64 MIL/uL — ABNORMAL HIGH (ref 3.87–5.11)
RDW: 13.9 % (ref 11.5–15.5)
WBC: 9.8 10*3/uL (ref 4.0–10.5)

## 2016-08-19 LAB — TROPONIN I: Troponin I: <0.03 ng/mL (ref <0.03)

## 2016-08-19 MED ORDER — HYDROMORPHONE HCL 1 MG/ML IJ SOLN
0.5000 mg | Freq: Once | INTRAMUSCULAR | Status: AC
Start: 2016-08-19 — End: 2016-08-19
  Administered 2016-08-19: 0.5 mg via INTRAVENOUS
  Filled 2016-08-19: qty 1

## 2016-08-19 MED ORDER — MORPHINE SULFATE (PF) 4 MG/ML IV SOLN
6.0000 mg | Freq: Once | INTRAVENOUS | Status: DC
  Filled 2016-08-19: qty 2

## 2016-08-19 MED ORDER — SODIUM CHLORIDE 0.9 % IV BOLUS (SEPSIS)
1000.0000 mL | Freq: Once | INTRAVENOUS | Status: AC
  Administered 2016-08-19: 1000 mL via INTRAVENOUS

## 2016-08-19 NOTE — ED Triage Notes (Signed)
Pt reports SBP> 200 over past week. Yesterday had left shoulder and arm pain, then pain spread to left chest. Pt went to [place] [facility] for eval. Pt left ama today due to poor pain control and treatment. BP is 147/97 at triage and reports chest pain decreased due to nitro given at [place].

## 2016-08-19 NOTE — ED Notes (Signed)
Patient transported to X-ray 

## 2016-08-21 NOTE — ED Provider Notes (Signed)
[facility]-EMERGENCY DEPT Provider Note   CSN: [id number] Arrival date & time: 08/19/16  1325     History   Chief Complaint Chief Complaint  Patient presents with  . Chest Pain  . Hypertension    HPI Julie Ballard is a 71 y.o. female.  HPI Patient reports a history of hypertension reports overall her blood pressures through most of last week.  She began having anterior chest pain left shoulder and arm pain which began yesterday has been constant since yesterday.  She seen last night and stayed overnight at [facility] yesterday for evaluation and eventually left the emergency department AGAINST MEDICAL ADVICE for which the patient reports was secondary to inadequate pain control.  On arrival to emergency department blood pressure is 147/97.  She still reports ongoing chest discomfort at this time there is been constant and not waxing and waning since yesterday.  No cough.  No fevers or chills.  No significant shortness of breath.  No history of MI.  She is most concerned about her blood pressure and is that her elevated blood pressures been leading to her chest discomfort.  She continues to check her blood pressure at home and finds it to be elevated.  No recent change in diet.  No other complaints at this time.  Patient remains frustrated with the care she received at [facility]   Past Medical History:  Diagnosis Date  . Complication of anesthesia SEVERE BRADYCARDIA WHEN TAKES BETA BLOCKERS PRIOR TO SURGERY   ANES. RECORD REQUESTED FROM 2004 [facility])  . DDD (degenerative disc disease)   . Depression   . DJD (degenerative joint disease)   . Fibromyalgia    SEVERE  . Frequency of urination   . GERD (gastroesophageal reflux disease)   . History of atrial fibrillation   . History of CHF (congestive heart failure)    CARDIOLOGIST ---  DR Swaziland  . History of diverticulitis of colon   . History of seizures    GESTATIONAL SEIZURES--  NONE SINCE  . Hyperlipidemia   .  Hypertension   . IBS (irritable bowel syndrome)   . Insomnia   . Nocturia   . Thyroid cyst   . Tremor, essential    TAKES ATIVAN    Patient Active Problem List   Diagnosis Date Noted  . Chest pain 10/21/2010  . Tobacco abuse 10/21/2010  . GERD (gastroesophageal reflux disease)   . Hypertension   . CHF (congestive heart failure) (HCC)   . Hyperlipidemia     Past Surgical History:  Procedure Laterality Date  . BILATERAL SALPINGOOPHORECTOMY  2004  . CARDIAC CATHETERIZATION  09/2000  FORSYTHE   NORMAL CORONARIES  . CARDIOVASCULAR STRESS TEST  12-22-2010  DR Swaz[place]and   NORMAL LEXISCAN STUDY/ EF81%  . CHOLECYSTECTOMY  2004  . CYSTO WITH HYDRODISTENSION N/A 09/17/2012   Procedure: CYSTOSCOPY/HYDRODISTENSION INSTILL MARCAINE & PYRIDIUM;  Surgeon: Anner Crete, MD;  Location: [facility];  Service: Urology;  Laterality: N/A;    INSTILL MARCAINE & PYRIDIUM  . CYSTO/ HOD/ INSTILLATION THERAPY  1999  &  2004  . ESOPHAGOGASTRODUODENOSCOPY  10/05/10   NORMAL  . HEMORROIDECTOMY  LAST ONE 1995  . SIGMOID RESECTION / RECTOPEXY  1993   SEVERE DIVERTICULOSIS  . TONSILLECTOMY AND ADENOIDECTOMY  AGE 17  . VAGINAL HYSTERECTOMY  AGE 65    OB History    No data available       Home Medications    Prior to Admission medications   Medication  Sig Start Date End Date Taking? Authorizing Provider  aspirin 81 MG tablet Take 81 mg by mouth 2 (two) times daily.    Yes Historical Provider, MD  cyclobenzaprine (FLEXERIL) 10 MG tablet Take 10 mg by mouth 3 (three) times daily as needed for muscle spasms.    Yes Historical Provider, MD  dicyclomine (BENTYL) 10 MG capsule Take 10 mg by mouth every 4 (four) hours as needed for spasms.    Yes Historical Provider, MD  ergocalciferol (VITAMIN D2) 50000 UNITS capsule Take 50,000 Units by mouth every 14 (fourteen) days.    Yes Historical Provider, MD  estrogens-methylTEST (ESTRATEST) 1.25-2.5 MG tablet Take 1 tablet by mouth daily.   Yes  Historical Provider, MD  fluocinonide (LIDEX) 0.05 % external solution Apply 1 application topically daily.   Yes Historical Provider, MD  gabapentin (NEURONTIN) 300 MG capsule Take 300-600 mg by mouth See admin instructions. Take 300 mg by mouth twice daily and take 600 mg by mouth at bedtime   Yes Historical Provider, MD  hydrochlorothiazide 25 MG tablet Take 25 mg by mouth 3 (three) times daily.    Yes Historical Provider, MD  ketoconazole (NIZORAL) 2 % shampoo Apply 1 application topically every 3 (three) days.   Yes Historical Provider, MD  levothyroxine (SYNTHROID, LEVOTHROID) 50 MCG tablet Take 50 mcg by mouth daily before breakfast.    Yes Historical Provider, MD  LORazepam (ATIVAN) 1 MG tablet Take 1 mg by mouth See admin instructions. Take 1 mg by mouth up to 5 times daily as needed for tremors   Yes Historical Provider, MD  meperidine (DEMEROL) 50 MG tablet Take 50 mg by mouth every 4 (four) hours as needed for moderate pain.    Yes Historical Provider, MD  Multiple Vitamins-Minerals (MULTIVITAMIN PO) Take 1 tablet by mouth daily.   Yes Historical Provider, MD  nitroGLYCERIN (NITROSTAT) 0.4 MG SL tablet Place 0.4 mg under the tongue every 5 (five) minutes as needed for chest pain.   Yes Historical Provider, MD  pantoprazole (PROTONIX) 40 MG tablet Take 40 mg by mouth daily.   Yes Historical Provider, MD  potassium chloride SA (K-DUR,KLOR-CON) 20 MEQ tablet Take 20 mEq by mouth daily.   Yes Historical Provider, MD  promethazine (PHENERGAN) 25 MG tablet Take 25 mg by mouth every 6 (six) hours as needed (TAKES W/ DEMEROL).    Yes Historical Provider, MD  propranolol (INDERAL) 40 MG tablet Take 40 mg by mouth 2 (two) times daily.    Yes Historical Provider, MD  simvastatin (ZOCOR) 40 MG tablet Take 40 mg by mouth daily.   Yes Historical Provider, MD  tolterodine (DETROL LA) 4 MG 24 hr capsule Take 4 mg by mouth daily.    Yes Historical Provider, MD  vitamin B-12 (CYANOCOBALAMIN) 1000 MCG  tablet Take 1,000 mcg by mouth daily.    Yes Historical Provider, MD  zolpidem (AMBIEN) 10 MG tablet Take 10 mg by mouth at bedtime.    Yes Historical Provider, MD  meperidine (DEMEROL) 50 MG/ML injection Inject 1 mL (50 mg total) into the muscle every 4 (four) hours as needed for moderate pain. Patient not taking: Reported on 08/19/2016 09/17/12   Bjorn Pippin, MD  phenazopyridine (PYRIDIUM) 200 MG tablet Take 1 tablet (200 mg total) by mouth 3 (three) times daily as needed for pain. Patient not taking: Reported on 08/19/2016 09/17/12   Bjorn Pippin, MD    Family History Family History  Problem Relation Age of Onset  . Heart  attack Father   . Alcohol abuse Sister   . Cirrhosis Sister   . Heart attack Brother     Social History Social History  Substance Use Topics  . Smoking status: Current Every Day Smoker    Packs/day: 0.50    Years: 20.00    Types: Cigarettes  . Smokeless tobacco: Never Used  . Alcohol use No     Allergies   Sulfa drugs cross reactors; Abilify [aripiprazole]; Biaxin [clarithromycin]; Codeine; Cymbalta [duloxetine hcl]; Doxycycline; Elavil [amitriptyline hcl]; Erythromycin; Halcion [triazolam]; Hydrocodone; Lamisil [terbinafine hcl]; Lipitor [atorvastatin]; Morphine and related; Mysoline [primidone]; Penicillins; Pravachol [pravastatin]; Pseudoephedrine hcl; Vytorin [ezetimibe-simvastatin]; Zofran; Zofran [ondansetron hcl]; and Adhesive [tape]   Review of Systems Review of Systems  All other systems reviewed and are negative.    Physical Exam Updated Vital Signs BP (!) 163/89   Pulse 65   Temp 97.9 F (36.6 C) (Oral)   Resp 16   SpO2 97%   Physical Exam  Constitutional: She is oriented to person, place, and time. She appears well-developed and well-nourished. No distress.  HENT:  Head: Normocephalic and atraumatic.  Eyes: EOM are normal.  Neck: Normal range of motion.  Cardiovascular: Normal rate, regular rhythm and normal heart sounds.     Pulmonary/Chest: Effort normal and breath sounds normal.  Abdominal: Soft. She exhibits no distension. There is no tenderness.  Musculoskeletal: Normal range of motion.  Neurological: She is alert and oriented to person, place, and time.  Skin: Skin is warm and dry.  Psychiatric: She has a normal mood and affect. Judgment normal.  Nursing note and vitals reviewed.    ED Treatments / Results  Labs (all labs ordered are listed, but only abnormal results are displayed) Labs Reviewed  CBC - Abnormal; Notable for the following:       Result Value   RBC 5.64 (*)    Hemoglobin 17.2 (*)    HCT 49.1 (*)    All other components within normal limits  BASIC METABOLIC PANEL - Abnormal; Notable for the following:    Sodium 132 (*)    Chloride 95 (*)    All other components within normal limits  TROPONIN I    EKG  EKG Interpretation  Date/Time:  Saturday August 19 2016 13:32:26 EDT Ventricular Rate:  79 PR Interval:  158 QRS Duration: 72 QT Interval:  392 QTC Calculation: 449 R Axis:   70 Text Interpretation:  Normal sinus rhythm Normal ECG No significant change was found Confirmed by Chevis Weisensel  MD, Caryn Bee ([id number]) on 08/19/2016 1:52:28 PM       Radiology Dg Chest 2 View  Result Date: 08/19/2016 CLINICAL DATA:  blood pressure has been "sky high all week" and she has been having left side neck and left arm pain; she reports yesterday, she began having left upper chest pain and left arm numbness as well and went to [facility] but left to come here this AM; she reports hx of AFIB and HTN; smoker EXAM: CHEST - 2 VIEW COMPARISON:  08/18/2016 FINDINGS: Lungs are clear.  No pneumothorax. Heart size and mediastinal contours are within normal limits. Atheromatous aorta. No effusion. Visualized bones unremarkable. IMPRESSION: No acute cardiopulmonary disease. Electronically Signed   By: Corlis Leak M.D.   On: 08/19/2016 14:29    Procedures Procedures (including critical care  time)  Medications Ordered in ED Medications  sodium chloride 0.9 % bolus 1,000 mL (0 mLs Intravenous Stopped 08/19/16 1654)  HYDROmorphone (DILAUDID) injection 0.5 mg (0.5 mg Intravenous  Given 08/19/16 1542)     Initial Impression / Assessment and Plan / ED Course  I have reviewed the triage vital signs and the nursing notes.  Pertinent labs & imaging results that were available during my care of the patient were reviewed by me and considered in my medical decision making (see chart for details).     Patient with constant chest pain and negative troponin here in emergency department.  Chest x-ray and EKG without abnormalities.  Doubt ACS.  Doubt dissection.  Doubt PE.  Patient overall well-appearing.  Blood pressure normalizing in the emergency department without treatment.  Close primary care follow-up.  She understands return to the ER for new or worsening symptoms.  No indication for additional workup or testing at this time.  I do not think she needs admission to the hospital  Final Clinical Impressions(s) / ED Diagnoses   Final diagnoses:  Precordial pain  Hypertension, unspecified type    New Prescriptions Discharge Medication List as of 08/19/2016  4:14 PM       Azalia Bilis, MD 08/22/16 [phone]

## 2020-03-30 ENCOUNTER — Encounter: Payer: Self-pay | Admitting: *Deleted

## 2020-03-30 ENCOUNTER — Ambulatory Visit: Payer: 59 | Admitting: Diagnostic Neuroimaging

## 2020-04-26 ENCOUNTER — Encounter: Payer: Self-pay | Admitting: *Deleted

## 2020-04-27 ENCOUNTER — Ambulatory Visit: Payer: Medicare Other | Admitting: Diagnostic Neuroimaging

## 2020-07-06 ENCOUNTER — Ambulatory Visit: Payer: Medicare Other | Admitting: Diagnostic Neuroimaging

## 2020-07-27 ENCOUNTER — Ambulatory Visit: Payer: Medicare Other | Admitting: Diagnostic Neuroimaging

## 2020-09-14 ENCOUNTER — Ambulatory Visit: Payer: Medicare Other | Admitting: Diagnostic Neuroimaging

## 2021-03-29 ENCOUNTER — Emergency Department (HOSPITAL_COMMUNITY): Payer: Medicare Other

## 2021-03-29 ENCOUNTER — Emergency Department (HOSPITAL_COMMUNITY)
Admission: EM | Admit: 2021-03-29 | Discharge: 2021-03-29 | Disposition: A | Discharge status: Other Acute Inpatient Hospital | Payer: Medicare Other | Attending: Emergency Medicine | Admitting: Emergency Medicine

## 2021-03-29 DIAGNOSIS — I639 Cerebral infarction, unspecified: Secondary | ICD-10-CM | POA: Insufficient documentation

## 2021-03-29 DIAGNOSIS — I509 Heart failure, unspecified: Secondary | ICD-10-CM | POA: Diagnosis not present

## 2021-03-29 DIAGNOSIS — E039 Hypothyroidism, unspecified: Secondary | ICD-10-CM | POA: Insufficient documentation

## 2021-03-29 DIAGNOSIS — Z79899 Other long term (current) drug therapy: Secondary | ICD-10-CM | POA: Insufficient documentation

## 2021-03-29 DIAGNOSIS — F17210 Nicotine dependence, cigarettes, uncomplicated: Secondary | ICD-10-CM | POA: Diagnosis not present

## 2021-03-29 DIAGNOSIS — I110 Hypertensive heart disease with heart failure: Secondary | ICD-10-CM | POA: Insufficient documentation

## 2021-03-29 DIAGNOSIS — I63532 Cerebral infarction due to unspecified occlusion or stenosis of left posterior cerebral artery: Secondary | ICD-10-CM

## 2021-03-29 DIAGNOSIS — Z7982 Long term (current) use of aspirin: Secondary | ICD-10-CM | POA: Diagnosis not present

## 2021-03-29 DIAGNOSIS — M6281 Muscle weakness (generalized): Secondary | ICD-10-CM | POA: Diagnosis present

## 2021-03-29 LAB — COMPREHENSIVE METABOLIC PANEL
ALT: 15 U/L (ref 0–44)
AST: 23 U/L (ref 15–41)
Albumin: 3.6 g/dL (ref 3.5–5.0)
Alkaline Phosphatase: 57 U/L (ref 38–126)
Anion gap: 8 (ref 5–15)
BUN: 12 mg/dL (ref 8–23)
CO2: 25 mmol/L (ref 22–32)
Calcium: 8.6 mg/dL — ABNORMAL LOW (ref 8.9–10.3)
Chloride: 105 mmol/L (ref 98–111)
Creatinine, Ser: 0.9 mg/dL (ref 0.44–1.00)
GFR, Estimated: >60 mL/min (ref >60)
Glucose, Bld: 100 mg/dL — ABNORMAL HIGH (ref 70–99)
Potassium: 3.7 mmol/L (ref 3.5–5.1)
Sodium: 138 mmol/L (ref 135–145)
Total Bilirubin: 0.8 mg/dL (ref 0.3–1.2)
Total Protein: 6.4 g/dL — ABNORMAL LOW (ref 6.5–8.1)

## 2021-03-29 LAB — CBC
HCT: 39.1 % (ref 36.0–46.0)
Hemoglobin: 13.2 g/dL (ref 12.0–15.0)
MCH: 31.1 pg (ref 26.0–34.0)
MCHC: 33.8 g/dL (ref 30.0–36.0)
MCV: 92.2 fL (ref 80.0–100.0)
Platelets: 166 10*3/uL (ref 150–400)
RBC: 4.24 MIL/uL (ref 3.87–5.11)
RDW: 13.4 % (ref 11.5–15.5)
WBC: 6.5 10*3/uL (ref 4.0–10.5)
nRBC: 0 % (ref 0.0–0.2)

## 2021-03-29 LAB — DIFFERENTIAL
Abs Immature Granulocytes: 0.01 10*3/uL (ref 0.00–0.07)
Basophils Absolute: 0.1 10*3/uL (ref 0.0–0.1)
Basophils Relative: 2 %
Eosinophils Absolute: 0.5 10*3/uL (ref 0.0–0.5)
Eosinophils Relative: 8 %
Immature Granulocytes: 0 %
Lymphocytes Relative: 25 %
Lymphs Abs: 1.7 10*3/uL (ref 0.7–4.0)
Monocytes Absolute: 0.7 10*3/uL (ref 0.1–1.0)
Monocytes Relative: 10 %
Neutro Abs: 3.6 10*3/uL (ref 1.7–7.7)
Neutrophils Relative %: 55 %

## 2021-03-29 LAB — CBG MONITORING, ED: Glucose-Capillary: 109 mg/dL — ABNORMAL HIGH (ref 70–99)

## 2021-03-29 LAB — I-STAT CHEM 8, ED
BUN: 12 mg/dL (ref 8–23)
Calcium, Ion: 1.08 mmol/L — ABNORMAL LOW (ref 1.15–1.40)
Chloride: 106 mmol/L (ref 98–111)
Creatinine, Ser: 0.9 mg/dL (ref 0.44–1.00)
Glucose, Bld: 95 mg/dL (ref 70–99)
HCT: 38 % (ref 36.0–46.0)
Hemoglobin: 12.9 g/dL (ref 12.0–15.0)
Potassium: 3.8 mmol/L (ref 3.5–5.1)
Sodium: 140 mmol/L (ref 135–145)
TCO2: 25 mmol/L (ref 22–32)

## 2021-03-29 LAB — PROTIME-INR
INR: 1.1 (ref 0.8–1.2)
Prothrombin Time: 14.3 seconds (ref 11.4–15.2)

## 2021-03-29 LAB — APTT: aPTT: 39 seconds — ABNORMAL HIGH (ref 24–36)

## 2021-03-29 MED ORDER — TENECTEPLASE FOR STROKE
0.2500 mg/kg | PACK | Freq: Once | INTRAVENOUS | Status: AC
Start: 2021-03-29 — End: 2021-03-29
  Administered 2021-03-29: 16 mg via INTRAVENOUS
  Filled 2021-03-29: qty 10

## 2021-03-29 MED ORDER — LABETALOL HCL 5 MG/ML IV SOLN
10.0000 mg | Freq: Once | INTRAVENOUS | Status: AC
  Administered 2021-03-29: 10 mg via INTRAVENOUS

## 2021-03-29 MED ORDER — CLEVIDIPINE BUTYRATE 0.5 MG/ML IV EMUL
0.0000 mg/h | INTRAVENOUS | Status: DC
  Administered 2021-03-29: 1 mg/h via INTRAVENOUS

## 2021-03-29 MED ORDER — IOHEXOL 350 MG/ML SOLN
50.0000 mL | Freq: Once | INTRAVENOUS | Status: AC | PRN
  Administered 2021-03-29: 50 mL via INTRAVENOUS

## 2021-03-29 MED ORDER — SODIUM CHLORIDE 0.9% FLUSH: 3.0000 mL | Freq: Once | INTRAVENOUS | Status: DC

## 2021-03-29 NOTE — ED Provider Notes (Signed)
[facility] EMERGENCY DEPARTMENT Provider Note   CSN: [id number] Arrival date & time: 03/29/21  2147     History Chief complaint: Acute weakness  Julie Ballard is a 75 y.o. female.  HPI  Patient was watching TV this evening with her husband.  At around 8 or 815 this evening she suddenly felt extremely weak.  Patient felt as if she was dying.  She felt like she was going to pass out.  He has been checked her blood pressure, it was elevated in the [id number] systolic and he  called EMS.  When they arrived they noted that the patient was unable to move the right side of her body.  She also had a vision deficit on the right side.  Code stroke was activated.  No recent falls or injuries.  No fevers.  Past Medical History:  Diagnosis Date   Anxiety    Chronic pain    Complication of anesthesia SEVERE BRADYCARDIA WHEN TAKES BETA BLOCKERS PRIOR TO SURGERY   ANES. RECORD REQUESTED FROM 2004 [facility])   DDD (degenerative disc disease)    Depression    DJD (degenerative joint disease)    Fibromyalgia    SEVERE   Frequency of urination    GAD (generalized anxiety disorder)    GERD (gastroesophageal reflux disease)    History of atrial fibrillation    History of CHF (congestive heart failure)    CARDIOLOGIST ---  DR [place]   History of diverticulitis of colon    History of seizures    GESTATIONAL SEIZURES--  NONE SINCE   Hyperlipidemia    Hypertension    Hypothyroidism (acquired)    IBS (irritable bowel syndrome)    Insomnia    Low back pain    Nocturia    Thyroid cyst    Tremor, essential    TAKES ATIVAN   Vitamin D deficiency     Patient Active Problem List   Diagnosis Date Noted   Chest pain 10/21/2010   Tobacco abuse 10/21/2010   GERD (gastroesophageal reflux disease)    Hypertension    CHF (congestive heart failure) ([place])    Hyperlipidemia     Past Surgical History:  Procedure Laterality Date   BILATERAL SALPINGOOPHORECTOMY  2004   CARDIAC  CATHETERIZATION  09/2000  FORSYTHE   NORMAL CORONARIES   CARDIOVASCULAR STRESS TEST  12-22-2010  DR [place]   NORMAL LEXISCAN STUDY/ EF81%   CHOLECYSTECTOMY  2004   CYSTO WITH HYDRODISTENSION N/A 09/17/2012   Procedure: CYSTOSCOPY/HYDRODISTENSION INSTILL MARCAINE & PYRIDIUM;  Surgeon: Malka So, MD;  Location: [facility];  Service: Urology;  Laterality: N/A;    INSTILL MARCAINE & PYRIDIUM   CYSTO/ HOD/ INSTILLATION THERAPY  1999  &  2004   ESOPHAGOGASTRODUODENOSCOPY  10/05/10   NORMAL   HEMORROIDECTOMY  LAST ONE 1995   SIGMOID RESECTION / RECTOPEXY  1993   SEVERE DIVERTICULOSIS   TONSILLECTOMY AND ADENOIDECTOMY  AGE 70   VAGINAL HYSTERECTOMY  AGE 75     OB History   No obstetric history on file.     Family History  Problem Relation Age of Onset   Heart attack Father    Alcohol abuse Sister    Cirrhosis Sister    Heart attack Brother     Social History   Tobacco Use   Smoking status: Every Day    Packs/day: 0.50    Years: 20.00    Pack years: 10.00    Types: Cigarettes   Smokeless  tobacco: Never  Substance Use Topics   Alcohol use: No   Drug use: No    Home Medications Prior to Admission medications   Medication Sig Start Date End Date Taking? Authorizing Provider  aspirin 81 MG tablet Take 81 mg by mouth 2 (two) times daily.     [provider]  cyclobenzaprine (FLEXERIL) 10 MG tablet Take 10 mg by mouth 3 (three) times daily as needed for muscle spasms.     [provider]  dicyclomine (BENTYL) 10 MG capsule Take 10 mg by mouth every 4 (four) hours as needed for spasms.     [provider]  ergocalciferol (VITAMIN D2) 50000 UNITS capsule Take 50,000 Units by mouth every 14 (fourteen) days.     [provider]  estrogens-methylTEST (ESTRATEST) 1.25-2.5 MG tablet Take 1 tablet by mouth daily.    [provider]  fluocinonide (LIDEX) 0.05 % external solution Apply 1 application topically daily.    [provider]  gabapentin (NEURONTIN) 300 MG capsule Take 300-600 mg by mouth See admin instructions. Take 300 mg by mouth twice daily and take 600 mg by mouth at bedtime    [provider]  hydrochlorothiazide 25 MG tablet Take 25 mg by mouth 3 (three) times daily.     [provider]  ketoconazole (NIZORAL) 2 % shampoo Apply 1 application topically every 3 (three) days.    [provider]  levothyroxine (SYNTHROID, LEVOTHROID) 50 MCG tablet Take 50 mcg by mouth daily before breakfast.     [provider]  LORazepam (ATIVAN) 1 MG tablet Take 1 mg by mouth See admin instructions. Take 1 mg by mouth up to 5 times daily as needed for tremors    [provider]  meperidine (DEMEROL) 50 MG tablet Take 50 mg by mouth every 4 (four) hours as needed for moderate pain.     [provider]  meperidine (DEMEROL) 50 MG/ML injection Inject 1 mL (50 mg total) into the muscle every 4 (four) hours as needed for moderate pain. Patient not taking: Reported on 08/19/2016 09/17/12   Bjorn Pippin, MD  Multiple Vitamins-Minerals (MULTIVITAMIN PO) Take 1 tablet by mouth daily.    [provider]  nitroGLYCERIN (NITROSTAT) 0.4 MG SL tablet Place 0.4 mg under the tongue every 5 (five) minutes as needed for chest pain.    [provider]  pantoprazole (PROTONIX) 40 MG tablet Take 40 mg by mouth daily.    [provider]  phenazopyridine (PYRIDIUM) 200 MG tablet Take 1 tablet (200 mg total) by mouth 3 (three) times daily as needed for pain. Patient not taking: Reported on 08/19/2016 09/17/12   Bjorn Pippin, MD  potassium chloride SA (K-DUR,KLOR-CON) 20 MEQ tablet Take 20 mEq by mouth daily.    [provider]  promethazine (PHENERGAN) 25 MG tablet Take 25 mg by mouth every 6 (six) hours as needed (TAKES W/ DEMEROL).     [provider]  propranolol (INDERAL) 40 MG tablet Take 40 mg by mouth 2 (two) times daily.     [provider]  simvastatin (ZOCOR) 40 MG tablet Take 40 mg by mouth daily.    [provider]  tolterodine (DETROL LA) 4 MG 24 hr capsule Take 4 mg by mouth daily.     [provider]  vitamin B-12 (CYANOCOBALAMIN) 1000 MCG tablet Take 1,000 mcg by mouth daily.     [provider]  zolpidem (AMBIEN) 10 MG tablet Take 10  mg by mouth at bedtime.     [provider]    Allergies    Sulfa drugs cross reactors, Abilify [aripiprazole], Biaxin [clarithromycin], Codeine, Cymbalta [duloxetine hcl], Doxycycline, Elavil [amitriptyline hcl], Erythromycin, Halcion [triazolam], Hydrocodone, Hydromorphone, Lamisil [terbinafine], Lipitor [atorvastatin], Morphine and related, Mysoline [primidone], Other, Penicillins, Pravachol [pravastatin], Pseudoephedrine hcl, Vytorin [ezetimibe-simvastatin], Zofran, Zofran [ondansetron hcl], and Adhesive [tape]  Review of Systems   Review of Systems  All other systems reviewed and are negative.  Physical Exam Updated Vital Signs BP (!) 154/68 (BP Location: Right Arm)   Pulse 68   Wt 62.2 kg   BMI 22.13 kg/m   Physical Exam Vitals and nursing note reviewed.  Constitutional:      General: She is not in acute distress.    Appearance: She is well-developed.  HENT:     Head: Normocephalic and atraumatic.     Right Ear: External ear normal.     Left Ear: External ear normal.  Eyes:     General: No scleral icterus.       Right eye: No discharge.        Left eye: No discharge.     Conjunctiva/sclera: Conjunctivae normal.  Neck:     Trachea: No tracheal deviation.  Cardiovascular:     Rate and Rhythm: Normal rate.  Pulmonary:     Effort: Pulmonary effort is normal. No respiratory distress.     Breath sounds: No stridor.  Abdominal:     General: There is no distension.  Musculoskeletal:        General: No swelling or deformity.     Cervical back: Neck supple.  Skin:    General: Skin is warm and dry.     Findings: No  rash.  Neurological:     Mental Status: She is alert.     Cranial Nerves: Cranial nerve deficit: Right-sided visual field cut.     Sensory: No sensory deficit.     Motor: Weakness present.     Comments: Right-sided hemiparesis    ED Results / Procedures / Treatments   Labs (all labs ordered are listed, but only abnormal results are displayed) Labs Reviewed  APTT - Abnormal; Notable for the following components:      Result Value   aPTT 39 (*)    All other components within normal limits  COMPREHENSIVE METABOLIC PANEL - Abnormal; Notable for the following components:   Glucose, Bld 100 (*)    Calcium 8.6 (*)    Total Protein 6.4 (*)    All other components within normal limits  I-STAT CHEM 8, ED - Abnormal; Notable for the following components:   Calcium, Ion 1.08 (*)    All other components within normal limits  CBG MONITORING, ED - Abnormal; Notable for the following components:   Glucose-Capillary 109 (*)    All other components within normal limits  RESP PANEL BY RT-PCR (FLU A&B, COVID) [id number]  PROTIME-INR  CBC  DIFFERENTIAL    EKG None  Radiology CT HEAD CODE STROKE WO CONTRAST  Result Date: 03/29/2021 CLINICAL DATA:  Code stroke. Initial evaluation for acute stroke. Visual disturbance. EXAM: CT HEAD WITHOUT CONTRAST TECHNIQUE: Contiguous axial images were obtained from the base of the skull through the vertex without intravenous contrast. COMPARISON:  Prior CT from 10/05/2004. FINDINGS: Brain: Subtle hypodensity at the left occipital lobe consistent with an evolving left PCA territory infarct. No acute intracranial hemorrhage. No other acute large vessel territory infarct. No mass lesion or midline shift. No hydrocephalus  or extra-axial fluid collection. Vascular: No hyperdense vessel. Skull: Scalp soft tissues and calvarium within normal limits. Sinuses/Orbits: Globes and orbital soft tissues within normal limits. Paranasal sinuses and mastoid air cells are clear. Other:  None. ASPECTS [facility] Stroke Program Early CT Score) - Ganglionic level infarction (caudate, lentiform nuclei, internal capsule, insula, M1-M3 cortex): 7 - Supraganglionic infarction (M4-M6 cortex): 3 Total score (0-10 with 10 being normal): 10 IMPRESSION: 1. Subtle hypodensity involving the left occipital lobe, consistent with an evolving left PCA distribution infarct. No intracranial hemorrhage. 2. ASPECTS is 10. These results were communicated to Dr. Lorrin Goodell at 10:15 pm on 03/29/2021 by text page via the [facility] messaging system. Electronically Signed   By: Jeannine Boga M.D.   On: 03/29/2021 22:17    Procedures Procedures   Medications Ordered in ED Medications  sodium chloride flush (NS) 0.9 % injection 3 mL (has no administration in time range)  clevidipine (CLEVIPREX) infusion 0.5 mg/mL (2 mg/hr Intravenous Rate/Dose Change 03/29/21 2233)  iohexol (OMNIPAQUE) 350 MG/ML injection 50 mL (50 mLs Intravenous Contrast Given 03/29/21 2208)  tenecteplase (TNKASE) injection for Stroke 16 mg (16 mg Intravenous Given 03/29/21 2223)  labetalol (NORMODYNE) injection 10 mg (10 mg Intravenous Given 03/29/21 2207)    ED Course  I have reviewed the triage vital signs and the nursing notes.  Pertinent labs & imaging results that were available during my care of the patient were reviewed by me and considered in my medical decision making (see chart for details).    MDM Rules/Calculators/A&P                           Patient was seen by Dr. Lorrin Goodell on arrival. Pt has hemiparesis and acute visual defect.  Concerning for LVO stroke.  Pt was given TNK after Dr Lorrin Goodell had a  discussion with patient and husband.  BP was elevated, given labetalol and cleveprex was started. CT angio performed.  Pt a candidate for IR thrombectomy.  Unfortunately, team is not available at [facility].  Dr Lorrin Goodell discussed with Dr Harvel Ricks at [place] and pt will be emergently transferred to there for intervention.  Final  Clinical Impression(s) / ED Diagnoses Final diagnoses:  Cerebrovascular accident (CVA), unspecified mechanism ([place])     Dorie Rank, MD 03/29/21 2243

## 2021-03-29 NOTE — Consult Note (Addendum)
NEUROLOGY CONSULTATION NOTE   Date of service: March 29, 2021 Patient Name: Julie Ballard MRN:  [id number] DOB:  Oct 10, 1945 Reason for consult: "Stroke code" Requesting Provider: Dorie Rank, MD _ _ _   _ __   _ __ _ _  __ __   _ __   __ _  History of Present Illness  Julie Ballard is a 75 y.o. female with PMH significant for DJD, GAD, GERD, HLD, HTN, hypothyroidism who presents with R Hemianopsia and RUE and RLE weakness.  Husband reports that patient was watching election results and did not feel right. Stood up and was walking in the home when she felt like she was going to fall. She sat on the ground. This was around 2015. Husband called EMS. Around 2030 shortly before EMS arrived, she was weak in RUE and RLE. EMS activated a code stroke.  CTH with subtle hypodensity in left PCA territory concerning for an acute stroke. CTA with a L PCA P2 occlusion.  mRS: 0 tNKase: offered. some delay 2/2 elevated SBP. Was started on Cleviprex for BP control. Was given at 2212. Thrombectomy: discussed with patient and her husband briefly over phone and they opted to consider potential thrombectomy. We had a case in IR undergoing thromebectomy. She was transferred to [facility] under care of Dr. Harvel Ricks for with Neuro Intervention. She left the [facility] ED with [facility] at 2250. I gave husbands cell phone number to [facility] prior to leaving the hospital. Dr. Harvel Ricks to consent the patient and her husband for potential thrombectomy. NIHSS components Score: Comment  1a Level of Conscious 0[x]  1[]  2[]  3[]      1b LOC Questions 0[x]  1[]  2[]       1c LOC Commands 0[x]  1[]  2[]       2 Best Gaze 0[x]  1[]  2[]       3 Visual 0[]  1[]  2[x]  3[]      4 Facial Palsy 0[x]  1[x]  2[]  3[]      5a Motor Arm - left 0[x]  1[]  2[]  3[]  4[]  UN[]    5b Motor Arm - Right 0[]  1[]  2[]  3[]  4[x]  UN[]    6a Motor Leg - Left 0[x]  1[]  2[]  3[]  4[]  UN[]    6b Motor Leg - Right 0[]  1[]  2[]  3[]  4[x]  UN[]    7 Limb Ataxia 0[x]  1[]  2[]  3[]   UN[]     8 Sensory 0[]  1[]  2[x]  UN[]      9 Best Language 0[]  1[x]  2[]  3[]      10 Dysarthria 0[]  1[x]  2[]  UN[]      11 Extinct. and Inattention 0[x]  1[]  2[]       TOTAL: 16       ROS   Constitutional Denies weight loss, fever and chills.   HEENT R hamianopsia, no hearing loss.  Respiratory Denies SOB and cough.   CV Denies palpitations and CP   GI Denies abdominal pain, nausea, vomiting and diarrhea.   GU Denies dysuria and urinary frequency.   MSK Denies myalgia and joint pain.   Skin Denies rash and pruritus.   Neurological Denies headache and syncope.   Psychiatric Denies recent changes in mood. Denies anxiety and depression.    Past History   Past Medical History:  Diagnosis Date   Anxiety    Chronic pain    Complication of anesthesia SEVERE BRADYCARDIA WHEN TAKES BETA BLOCKERS PRIOR TO SURGERY   ANES. RECORD REQUESTED FROM 2004 [facility])   DDD (degenerative disc disease)    Depression    DJD (degenerative joint disease)    Fibromyalgia  SEVERE   Frequency of urination    GAD (generalized anxiety disorder)    GERD (gastroesophageal reflux disease)    History of atrial fibrillation    History of CHF (congestive heart failure)    CARDIOLOGIST ---  [facility] [place]   History of diverticulitis of colon    History of seizures    GESTATIONAL SEIZURES--  NONE SINCE   Hyperlipidemia    Hypertension    Hypothyroidism (acquired)    IBS (irritable bowel syndrome)    Insomnia    Low back pain    Nocturia    Thyroid cyst    Tremor, essential    TAKES ATIVAN   Vitamin D deficiency    Past Surgical History:  Procedure Laterality Date   BILATERAL SALPINGOOPHORECTOMY  2004   CARDIAC CATHETERIZATION  09/2000  [facility]   NORMAL CORONARIES   CARDIOVASCULAR STRESS TEST  12-22-2010  DR [place]   NORMAL LEXISCAN STUDY/ EF81%   CHOLECYSTECTOMY  2004   CYSTO WITH HYDRODISTENSION N/A 09/17/2012   Procedure: CYSTOSCOPY/HYDRODISTENSION INSTILL MARCAINE & PYRIDIUM;  Surgeon: Malka So,  MD;  Location: [facility];  Service: Urology;  Laterality: N/A;    INSTILL MARCAINE & PYRIDIUM   CYSTO/ HOD/ INSTILLATION THERAPY  1999  &  2004   ESOPHAGOGASTRODUODENOSCOPY  10/05/10   NORMAL   HEMORROIDECTOMY  LAST ONE 1995   SIGMOID RESECTION / RECTOPEXY  1993   SEVERE DIVERTICULOSIS   TONSILLECTOMY AND ADENOIDECTOMY  AGE 47   VAGINAL HYSTERECTOMY  AGE 26   Family History  Problem Relation Age of Onset   Heart attack Father    Alcohol abuse Sister    Cirrhosis Sister    Heart attack Brother    Social History   Socioeconomic History   Marital status: Married    Spouse name: Not on file   Number of children: 2   Years of education: Not on file   Highest education level: Not on file  Occupational History   Occupation: Education administrator    Comment: disabled  Tobacco Use   Smoking status: Every Day    Packs/day: 0.50    Years: 20.00    Pack years: 10.00    Types: Cigarettes   Smokeless tobacco: Never  Substance and Sexual Activity   Alcohol use: No   Drug use: No   Sexual activity: Not on file  Other Topics Concern   Not on file  Social History Narrative   Not on file   Social Determinants of Health   Financial Resource Strain: Not on file  Food Insecurity: Not on file  Transportation Needs: Not on file  Physical Activity: Not on file  Stress: Not on file  Social Connections: Not on file   Allergies  Allergen Reactions   Sulfa Drugs Cross Reactors Hives, Swelling and Other (See Comments)    SEVERE   Abilify [Aripiprazole] Other (See Comments)    Unknown   Biaxin [Clarithromycin] Other (See Comments)    Unknown   Codeine Hives and Swelling   Cymbalta [Duloxetine Hcl] Nausea And Vomiting and Other (See Comments)    DILEARIUS   Doxycycline Hives and Swelling   Elavil [Amitriptyline Hcl] Other (See Comments)    "MAKES ME GO CRAZY"   Erythromycin Hives and Swelling   Halcion [Triazolam] Other (See Comments)    Unknown   Hydrocodone  Other (See Comments)    Unknown   Hydromorphone Nausea And Vomiting   Lamisil [Terbinafine] Other (See Comments)  Unknown   Lipitor [Atorvastatin] Other (See Comments)    Unknown, upset stomach   Morphine And Related Nausea And Vomiting and Other (See Comments)    SEVERE   Mysoline [Primidone] Other (See Comments)    "MAKES ME GO CRAZY"   Other Other (See Comments)    Flu Vaccine- Sick for 3 months"   Penicillins Hives and Swelling   Pravachol [Pravastatin] Other (See Comments)    Unknown   Pseudoephedrine Hcl Other (See Comments)    Unknown   Vytorin [Ezetimibe-Simvastatin] Other (See Comments)    Unknown   Zofran Nausea And Vomiting and Other (See Comments)    SEVERE   Zofran [Ondansetron Hcl] Other (See Comments)    Unknown   Adhesive [Tape] Rash    Medications  (Not in a hospital admission)    Vitals   Vitals:   03/29/21 2100  Weight: 62.2 kg     Body mass index is 22.13 kg/m.  Physical Exam   General: crying in the gurney; emotionally distressed. HENT: Normal oropharynx and mucosa. Normal external appearance of ears and nose.  Neck: Supple, no pain or tenderness  CV: No JVD. No peripheral edema.  Pulmonary: Symmetric Chest rise. Normal respiratory effort.  Abdomen: Soft to touch, non-tender.  Ext: No cyanosis, edema, or deformity  Skin: No rash. Normal palpation of skin.   Musculoskeletal: Normal digits and nails by inspection. No clubbing.   Neurologic Examination  Mental status/Cognition: Alert, oriented to self, place, month and year, good attention.  Speech/language: Dysarthric speech, fluent, comprehension intact, object naming intact to most objects, repetition intact.  Cranial nerves:   CN II Pupils equal and reactive to light, right hemianopsia   CN III,IV,VI EOM intact, no gaze preference or deviation, no nystagmus    CN V normal sensation in V1, V2, and V3 segments bilaterally    CN VII Mild flattening of the nasolabial fold on the right.    CN VIII normal hearing to speech    CN IX & X normal palatal elevation, no uvular deviation    CN XI    CN XII midline tongue protrusion   Motor:  Muscle bulk: poor, tone flaccid in right upper extremity and right lower extremity. Mvmt Root Nerve  Muscle Right Left Comments  SA C5/6 Ax Deltoid     EF C5/6 Mc Biceps 0 5   EE C6/7/8 Rad Triceps 0 5   WF C6/7 Med FCR     WE C7/8 PIN ECU     F Ab C8/T1 U ADM/FDI 1 5   HF L1/2/3 Fem Illopsoas 0 5   KE L2/3/4 Fem Quad  5   DF L4/5 D Peron Tib Ant 0 5   PF S1/2 Tibial Grc/Sol 1 5    Sensation:  Light touch Decreased in RUE and RLE   Pin prick    Temperature    Vibration   Proprioception    Coordination/Complex Motor:  - Finger to Nose intact on the left, unable to do on the right. - Heel to shin unable to do. - Rapid alternating movement are intact on the left. - Gait: Unsafe to assess gait given hemiplegic on the right. Labs   CBC: No results for input(s): WBC, NEUTROABS, HGB, HCT, MCV, PLT in the last 168 hours.  Basic Metabolic Panel:  Lab Results  Component Value Date   NA 132 (L) 08/19/2016   K 4.0 08/19/2016   CO2 31 08/19/2016   GLUCOSE 99 08/19/2016   BUN  7 08/19/2016   CREATININE 0.94 08/19/2016   CALCIUM 10.0 08/19/2016   GFRNONAA >60 08/19/2016   GFRAA >60 08/19/2016   Lipid Panel: No results found for: LDLCALC HgbA1c: No results found for: HGBA1C Urine Drug Screen: No results found for: LABOPIA, COCAINSCRNUR, LABBENZ, AMPHETMU, THCU, LABBARB  Alcohol Level No results found for: [facility]  CT Head without contrast: Personally reviewed and notable for subtle left PCA hypodensity concerning for an acute infarct.  CT angio Head and Neck with contrast: Personally reviewed and notable for a left PCA P2 occlusion.   Impression   Julie Ballard is a 75 y.o. female with PMH significant for DJD, GAD, GERD, HLD, HTN, hypothyroidism who presents with R Hemianopsia and RUE and RLE weakness.  Found to have a subtle  hypodensity in the left occipital lobe concerning for a left PCA distribution stroke.  CT angio with a left PCA P2 occlusion. She was given TNKase after her blood pressure was brought down with clevidipine and she was transferred to [facility] for thrombectomy.  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    Secondary Diagnosis: Essential (primary) hypertension  Recommendations  Plan: - Frequent NeuroChecks for post tNK care per stroke unit protocol: - Initial CTH demonstrated no acute hemorrhage or mass - MRI Brain - pending - CTA - L PCA P2 occlusion - TTE - pending - Lipid Panel: LDL - pending.  - Statin: if LDL > 70 - HbA1c: pending. - Antithrombotic: Start ASA 81 mg daily if 24 h CTH does not show acute hemorrhage - DVT prophylaxis: SCDs. Pharmacologic prophylaxis if 24 h CTH does not demonstrate acute hemorrhage - Smoking cessation: will need to counsel her on the importance of quitting smoking. - Systolic Blood Pressure goal: < 180 mm Hg - Telemetry monitoring for arrhythmia: 72 hours - Swallow screen - ordered - PT/OT/SLP consults  Just as the patient was leaving with [facility], I called to inform her husband Mr. Chelsia Swallows that patient is leaving for [facility].  This patient is critically ill and at significant risk of neurological worsening, death and care requires constant monitoring of vital signs, hemodynamics,respiratory and cardiac monitoring, neurological assessment, discussion with family, other specialists and medical decision making of high complexity. I spent 60 minutes of neurocritical care time  in the care of  this patient. This was time spent independent of any time provided by nurse practitioner or PA.  Donnetta Simpers [facility] Pager Number [phone] 03/29/2021  11:48 PM   ______________________________________________________________________   Thank you for the opportunity  to take part in the care of this patient. If you have any further questions, please contact the neurology consultation attending.  Signed,  Donnetta Simpers [facility] Pager Number [phone] _ _ _   _ __   _ __ _ _  __ __   _ __   __ _   tPA Checklist   tPA Inclusion criteria  [x]  Age > 18 years  [x]  LKW < 4.5 hrs  [x]  New disabling symptoms that are suggestive of a stroke   [x] Viewed by patient or family as disabling and interfering with work, hobbies and/or entertainment   [x] Complete Hemianopsia or Moderate to Severe Aphasia.   [] Visual or sensory extinction.   [] Any weakness limiting sustained effort against gravity.   [x] NIHSS > 5   [] Early improvement but remains moderately impaired and potentially disabled   tPA exclusion criteria  []  LKW > 4.5 hrs  []   Rapidly improving symptoms or resolution of symptoms  []  CTH demonstrates Intracranial Hemorrhage  []  CT exhibits extensive regions (> 1/3 MCA territory on CT) of Clear Hypoattenuation  []  Unable to maintain BP < 185/110 despite aggressive antihypertensive treatment  []  Severe head trauma within last 3 months  []  Gastrointestinal or genitourinary bleeding within last 21 days or structural GI malignancy  []  Active internal bleeding  []  Arterial puncture at non-compressible site within last 7 days  []  Infective endocarditis  []  Suspected Aortic dissection  []  Intracranial intra-axial neoplasm.(intracranial extra-axial neoplasm is not an exclusion)  []  Intracranial or spinal surgery within last 3 months  Medications  []  Full dose LMWH within last 24 hrs  []  Received novel oral anticoagulant within last 48 hours (assuming normal renal function). Commonly prescribed DOACs include Apixaban (Eliquis), Dabigatran (Pradaxa), Rivaroxaban (Xarelto), Edoxaban (Savaysa), Betrixaban (Bevyxxa)  []  Warfarin (Coumadin) within the last 24 hours with INR greater than 1.7 or PT > 15.  Labs  []  Blood glucose < 50mg /dL, should  treat if persistent symptoms after glucose normalized.  []  INR > 1.7 (results note required before treatment unless patient is on anticoagulant therapy or there is another reason to suspect abnormality)  []   Platelet count < 100,000  []  PT > 15  []  aPTT > 40 secs  SPECIAL CONSIDERATIONS for tPA  []  Mild stroke with non-disabling symptoms or rapidly improving symptoms- tPA not recommended.  []  Pregnancy Discuss risks with patient including following: [] Can be considered for moderate to severe stroke when anticipated benefit of treating moderate to severe stroke outweighs the anticipated increased risk of uterine bleeding. [] Alteplase is listed as pregnancy category C, indicating possible embryocidal risk based on animal experiments at high doses. There are no well controlled studies in humans. [] Animal studies of Alteplase at 1mg /Kg did not show fetal toxicity or teratogenic effects. [] Based on molecular weight, tPA is not expected to cross the placenta. [] The most relevant risk of alteplase in pregnancy is related to the risk of uterine bleeding. Known placenta previa or history of obstetric hemorrhage is a high risk. Preeclampsia/Eclampsia/HELLP can predispose to [place].  []  Major surgery within 14 days. The potential increased risk of surgical site hemorrhage should be weighed against potential disability from ischemic stroke related neurological deficit.   Major trauma (with no severe head trauma) within 14 days. The potential risk of bleeding from injuries related to trauma should be weighed against potential disability from ischemic stroke related neurological deficit.  []  Seizure at onset. tPA is reasonable if evidence suggests that residual impairments are secondary to stroke and not a postictal phenomenon. (Class IIa; Level of evidence C)  []  Myocardial infarction within last 3 months.  []  Acute Pericarditis or suspected post MI pericarditis. Risk of pericardial hemorrhage by lytics and  cardiac rupture within necrotic myocardial tissue.  []  Known left atrial or ventricular thrombus.  [] For patients with major acute ischemic stroke likely to produce severe disability and known left atrial or ventrivular thrombus, treatment with tPA maybe reasonable. [] For patients presenting with moderate acute ischemic stroke, likely to produce moderate disability and known left atrial or ventricular thrombus, treatment with IV tPA is of uncertain benefit.  []  Lumbar Puncture within 7 days.  []  Condition or history of bleeding diathesis which would pose significant bleeding risk to patient  []  History of Intracranial hemorrhage  []  Cerebral AVM, aneurysms > 42mm, intracranial neoplasm are associated with high risk of Intracranial Hemorrhage. tPA should be avoided.  []  Blood glucose >  400mg /dL  []  Wake up stroke, unknown time of onset  []  Blood glucose > 400mg /dL (hyperglycemia may accelerate tissue infarct after ischemia and decrease the chances of unsuccessful recannulization)  []  History of diabetic hemorrhagic retinopathy or other hemorrhagic ophthalmic conditions. Potential increased risk of vision loss should be weighed against anticipated benefits of reduced stroke related neurologic deficit.  []  Risk of symptomatic ICH in psychogenic population is very low. Starting IV tPA is probably recommended. (Class IIa; Level of evidence B)  []    []    []    []

## 2021-03-29 NOTE — Progress Notes (Addendum)
PHARMACIST CODE STROKE RESPONSE  Notified to mix TNK at 2210 by Dr. Derry Lory Delivered TNK to RN at 2212  TNK dose = 16 mg IV over 5 seconds  Issues/delays encountered (if applicable): BP issues - was given labetalol 10mg  prior to TNK decision however SBP remained over . Decision made to mix TNK and SBP still elevated. Started on clevidipine infusion immediately to bring down SBP.   , PharmD, [facility] Emergency Medicine Clinical Pharmacist ED RPh Phone: [phone] Main RX: [phone]

## 2021-03-29 NOTE — Code Documentation (Signed)
Stroke Response Nurse Documentation Code Documentation  Julie Ballard is a 75 y.o. female arriving to [facility] ED via [facility] on 11/8 with past medical hx of HTN, HLD, . On aspirin 81 mg daily. Code stroke was activated by EMS.   Patient from home where she was LKW at 2030 and now complaining of right sided weakness and slurred speech .   Stroke team at the bedside on patient arrival. Labs drawn and patient cleared for CT by Dr. Adela Lank. Patient to CT with team. NIHSS 16, see documentation for details and code stroke times. Patient with disoriented, right hemianopia, right facial droop, right arm weakness, right leg weakness, right decreased sensation, Expressive aphasia , and dysarthria  on exam. The following imaging was completed:  CT, CTA head and neck. Patient is a candidate for IV Thrombolytic due to fixed neurological deficit. Patient is a candidate for IR due to Left PCA occlusion.   Care/Plan: Tx to [facility] for mechanical thrombectomy.   Bedside handoff with [facility] RN Britta Mccreedy.    Rose Fillers  Rapid Response RN

## 2021-03-29 NOTE — ED Notes (Signed)
BIB [facility] from home c/o sudden onset right-sided weakness, slurred speech, and vision changes. LKW 2030pm  per husband, no prior stroke history. NIHSS initial 16; admin TNK once parameters were met. See additional documentation. Pt a/o x 4 but is having speech and processing difficulties. Able to answer questions. Bilateral 18ga IV in place

## 2021-06-30 ENCOUNTER — Other Ambulatory Visit: Payer: Self-pay

## 2021-06-30 NOTE — Patient Outreach (Signed)
[facility]) Care Management  06/30/2021  Vanessa Kampf [birth year] [phone]   First telephone outreach attempt to obtain mRS. No answer. Left message for returned call.  Vanice Sarah [facility] Management Assistant [phone]

## 2021-07-08 ENCOUNTER — Other Ambulatory Visit: Payer: Self-pay

## 2021-07-08 NOTE — Patient Outreach (Signed)
[facility]) Care Management  07/08/2021  Julie Ballard 23-Nov-1945 [id number]   Second telephone outreach attempt to obtain mRS. No answer. Left message for returned call.  Philmore Pali [facility] Management Assistant [phone]

## 2021-07-11 ENCOUNTER — Other Ambulatory Visit: Payer: Self-pay

## 2021-07-20 ENCOUNTER — Encounter: Payer: Self-pay | Admitting: Gastroenterology

## 2021-07-22 ENCOUNTER — Other Ambulatory Visit: Payer: Self-pay

## 2021-07-22 NOTE — Patient Outreach (Signed)
[facility]) Care Management ? ?07/22/2021 ? ?Makari Siek ?[birth year] ?[id number] ? ? ?Telephone outreach to patient to obtain mRS on 07/11/21 was successfully completed. MRS= 1 ? ?Philmore Pali ?[facility] Management Assistant ?[phone] ? ?

## 2021-08-23 ENCOUNTER — Telehealth: Payer: Self-pay | Admitting: Gastroenterology

## 2021-08-23 NOTE — Telephone Encounter (Signed)
Please see note below FYI 

## 2021-08-23 NOTE — Telephone Encounter (Signed)
Patient received recall colon letter; however, she wanted you to know it would be a few more months before she could schedule it as she's had a stroke and her doctor recommended her not to have any procedures done at this time.  She will call us back when she is able to do the procedure.  Thank you. ?

## 2021-08-29 NOTE — Telephone Encounter (Signed)
Absolutely ?Do only when it is okay with neurology ?RG ?

## 2022-07-21 IMAGING — CT CT ANGIO HEAD
2 of 7 series · 8 of 33 positions shown · IV contrast (APPLIED)
Comparison: Head CT from earlier the same day.

CLINICAL DATA: Initial evaluation for acute stroke, visual
disturbance.

EXAM:
CT ANGIOGRAPHY HEAD AND NECK
TECHNIQUE: Multidetector CT imaging of the head and neck was performed using
the standard protocol during bolus administration of intravenous
contrast. Multiplanar CT image reconstructions and MIPs were
obtained to evaluate the vascular anatomy. Carotid stenosis
measurements (when applicable) are obtained utilizing NASCET
criteria, using the distal internal carotid diameter as the
denominator.
CONTRAST:  50mL OMNIPAQUE IOHEXOL 350 MG/ML SOLN

[Series 5: cta neck/head · axial · 0.41mm/px · z∈[-196,-86]mm · 2 of 166 slices shown]
[im 56/166  soft-tissue]
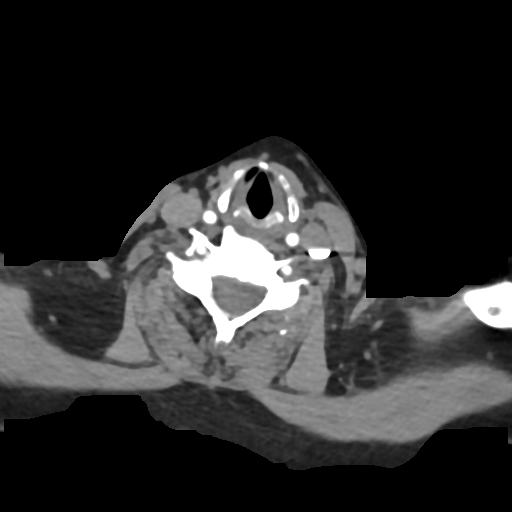
[im 111/166  soft-tissue]
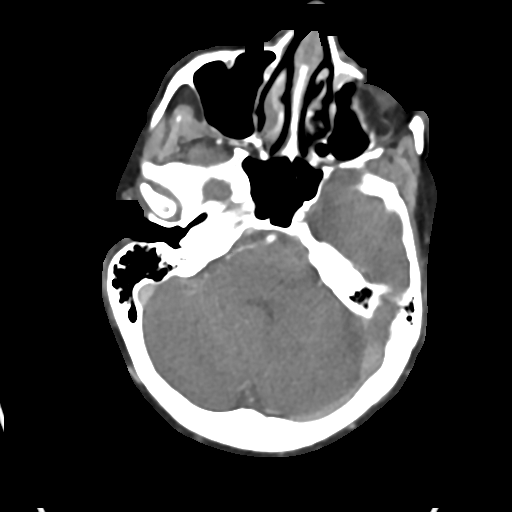

[Series 7: ax thins · axial · 0.39mm/px · z∈[-260,-24]mm · 6 of 332 slices shown]
[im 48/332  soft-tissue]
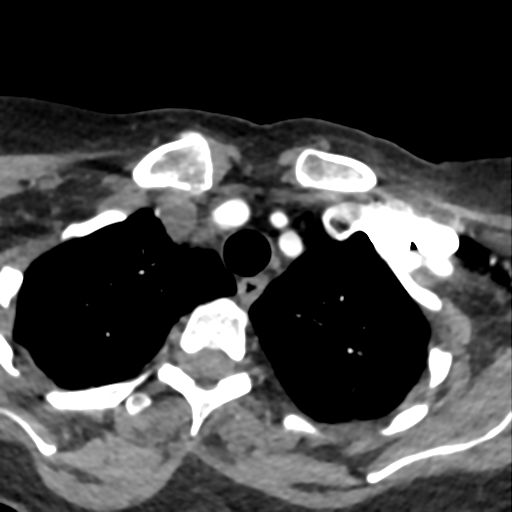
[im 95/332  bone]
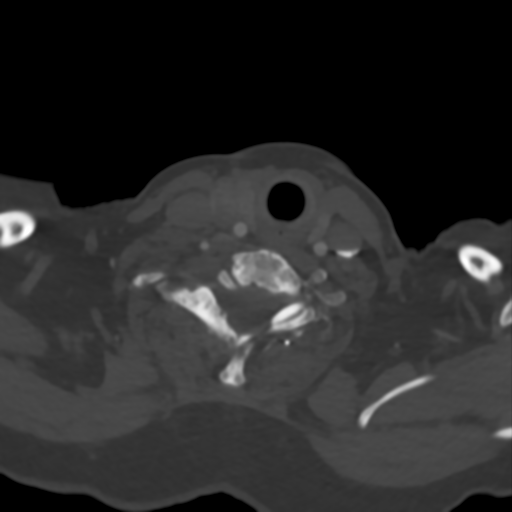
[im 142/332  soft-tissue]
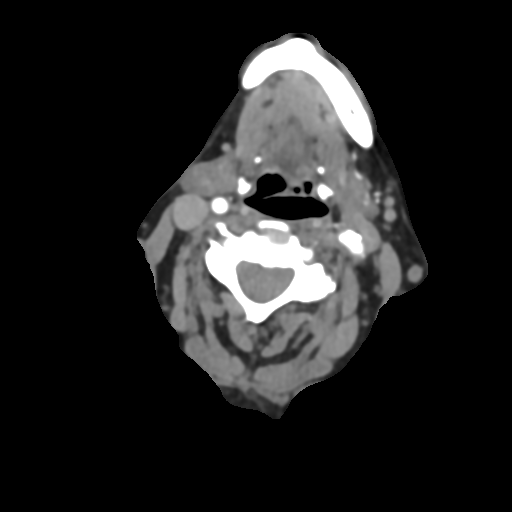
[im 190/332  bone]
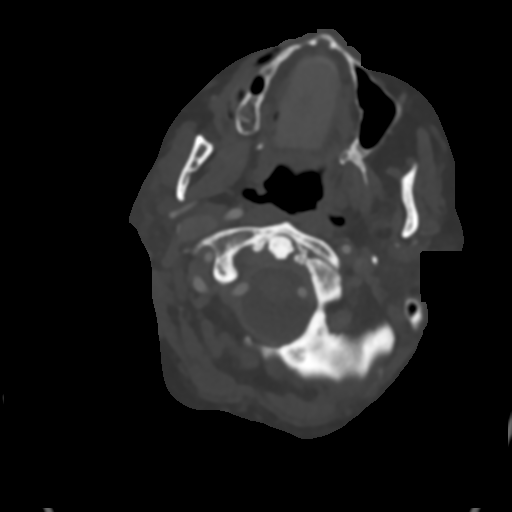
[im 237/332  soft-tissue]
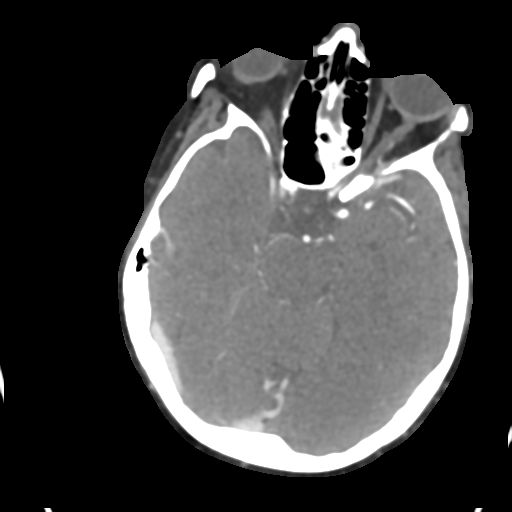
[im 284/332  bone]
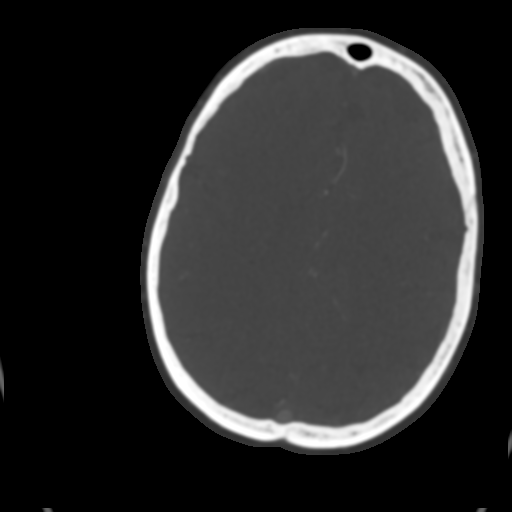

[8 of 33 positions shown; findings below may reference images not displayed]

FINDINGS: CTA NECK FINDINGS

Aortic arch: Visualized aortic arch normal caliber with normal
branch pattern. Mild-to-moderate atheromatous change about the arch
itself. No stenosis about the origin the great vessels.

Right carotid system: Right CCA widely patent to the bifurcation.
Atheromatous change about the right carotid bulb with associated
stenosis of up to approximately 50% by NASCET criteria. Right ICA
patent distally without stenosis, dissection or occlusion.

Left carotid system: Left CCA patent from its origin to the
bifurcation without stenosis. Atheromatous change about the left
carotid bulb with associated stenosis of up to approximately 50% by
NASCET criteria. Left ICA patent distally without stenosis
dissection or occlusion.

Vertebral arteries: Both vertebral arteries arise from the
subclavian arteries. No proximal subclavian artery stenosis. Both
vertebral arteries widely patent without stenosis, dissection or
occlusion.

Skeleton: No discrete or worrisome osseous lesions. Moderate
multilevel cervical spondylosis, most pronounced at C5-6 and C6-7.
Patient is edentulous.

Other neck: Heterogeneous right thyroid nodule measures up to
cm. No other acute soft tissue abnormality within the neck.

Upper chest: Scattered atelectatic changes noted within the
visualized lungs. Visualized upper chest demonstrates no other acute
finding.

Review of the MIP images confirms the above findings

CTA HEAD FINDINGS

Anterior circulation: Petrous segments patent bilaterally. Mild
atheromatous change within the carotid siphons without significant
stenosis. A1 segments patent bilaterally. Normal anterior
communicating artery complex. Short-segment moderate to severe left
A2 stenosis noted (series 12, image 24). Anterior cerebral arteries
otherwise widely patent to their distal aspects. No M1 stenosis or
occlusion. Normal MCA bifurcations. Distal MCA branches perfused and
symmetric.

Posterior circulation: Both vertebral arteries patent to the
vertebrobasilar junction without stenosis. Both PICA origins patent.
Basilar patent to its distal aspect without stenosis. Superior
cerebellar arteries patent bilaterally. Both PCAs primarily supplied
via the basilar. Right PCA well perfused to its distal aspect. There
is acute left P2 occlusion (series 8, image 102). Finding in keeping
with the previously identified left PCA territory infarct.

Venous sinuses: Grossly patent allowing for timing the contrast
bolus.

Anatomic variants: None significant.

Review of the MIP images confirms the above findings
IMPRESSION: 1. Acute left P2 occlusion, consistent with the previously
identified evolving left PCA territory infarct.
2. Atheromatous change about the carotid bifurcations with
associated stenoses of up to 50% by NASCET criteria bilaterally.
3. Short-segment moderate to severe left A2 stenosis.
4. 2.7 cm right thyroid nodule, indeterminate. Further evaluation
with dedicated thyroid ultrasound recommended. This could be
performed on a nonemergent outpatient basis. (ref: [HOSPITAL].
[DATE]): 143-50).

Critical Value/emergent results were called by telephone at the time
of interpretation on 03/29/2021 at [DATE] p.m. to provider LARSON
DREY , who verbally acknowledged these results.

## 2022-07-21 IMAGING — CT CT HEAD CODE STROKE
4 series · 16 of 47 positions shown, 18 images · non-contrast
Comparison: Prior CT from 10/05/2004.

CLINICAL DATA: Code stroke. Initial evaluation for acute stroke.
Visual disturbance.

EXAM:
CT HEAD WITHOUT CONTRAST
TECHNIQUE: Contiguous axial images were obtained from the base of the skull
through the vertex without intravenous contrast.

[Series 3: head wo · axial · 0.39mm/px · z∈[-110,+6]mm · 7 of 31 slices shown, 9 images]
[im 4/31  brain]
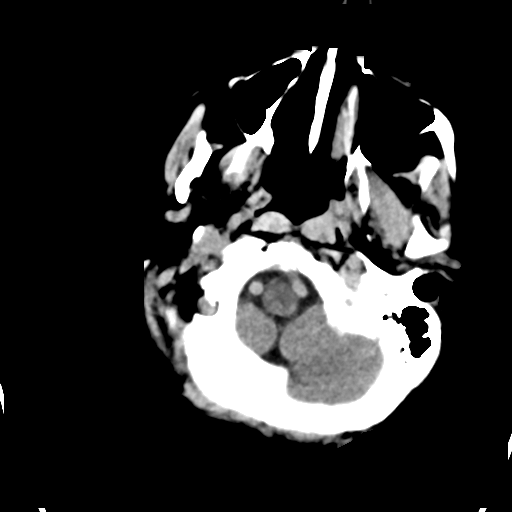
[im 4/31  bone]
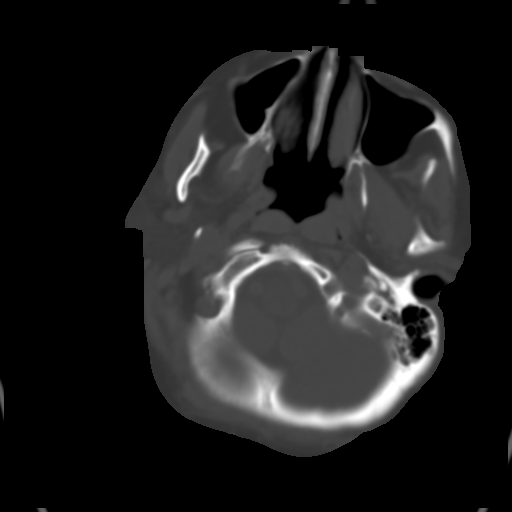
[im 8/31  brain]
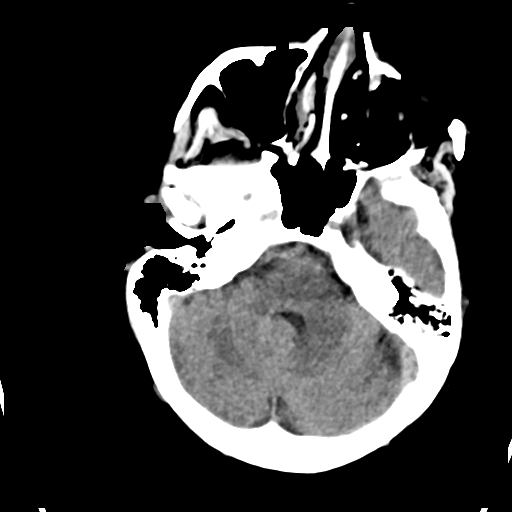
[im 12/31  brain]
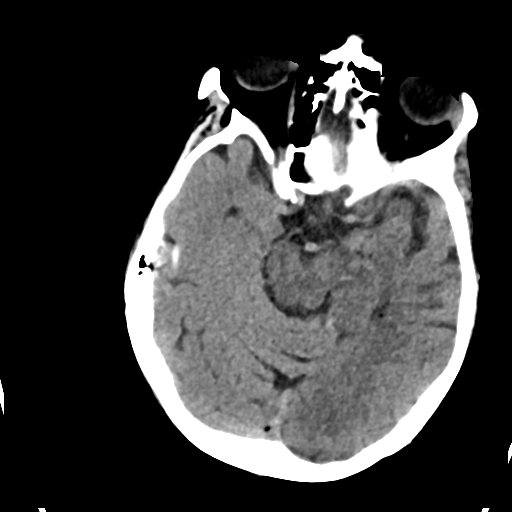
[im 16/31  brain]
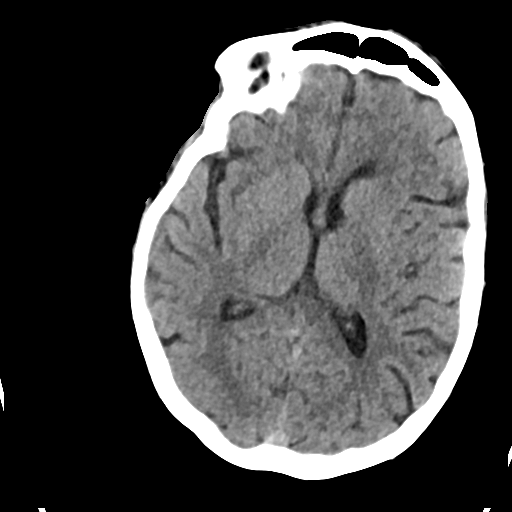
[im 19/31  brain]
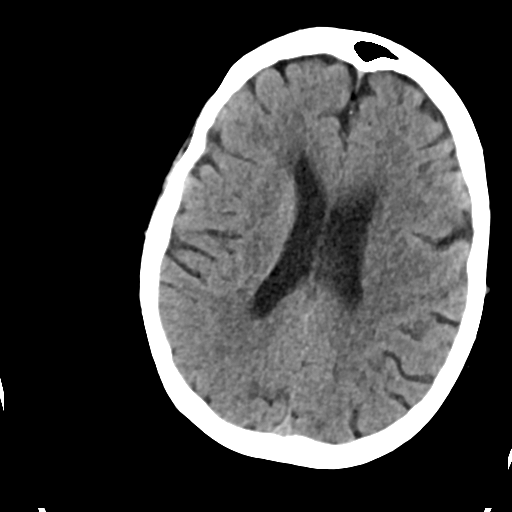
[im 19/31  bone]
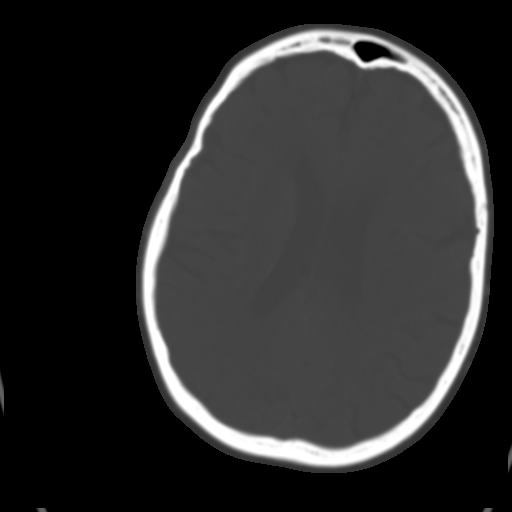
[im 23/31  brain]
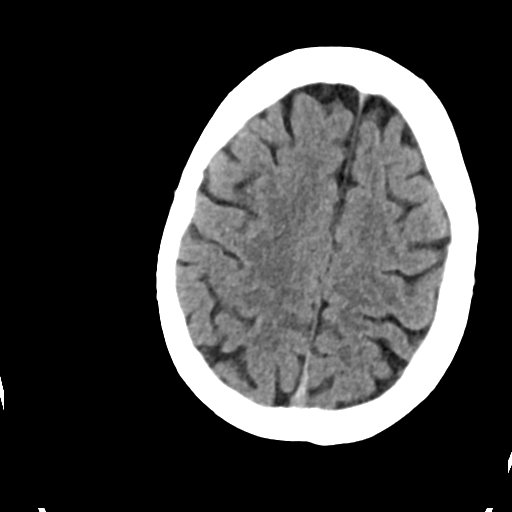
[im 27/31  brain]
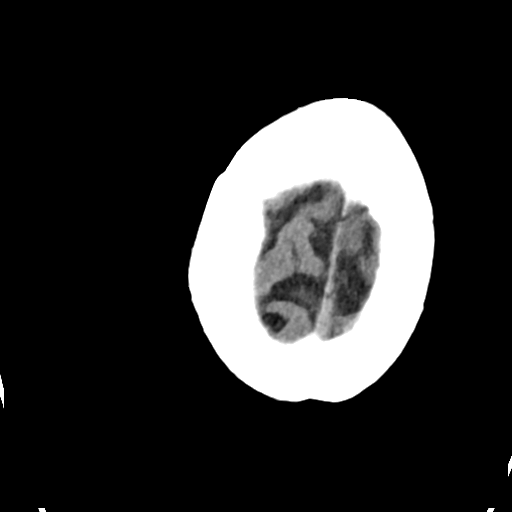

[Series 4: head bone · axial · 0.39mm/px · z∈[-110,-78]mm · 3 of 78 slices shown]
[im 8/78  bone]
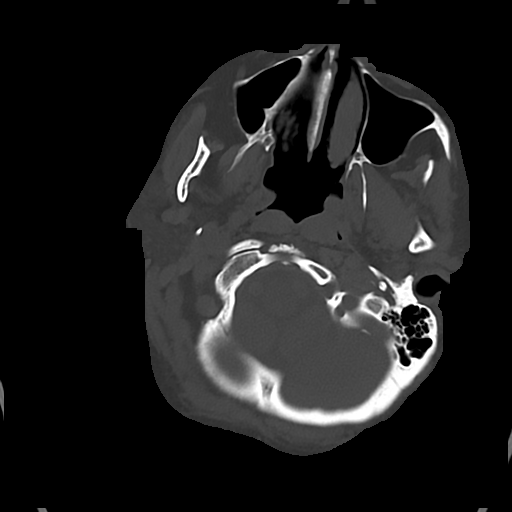
[im 16/78  bone]
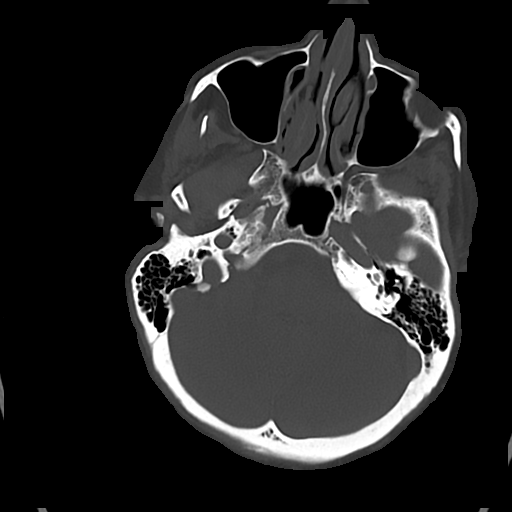
[im 24/78  bone]
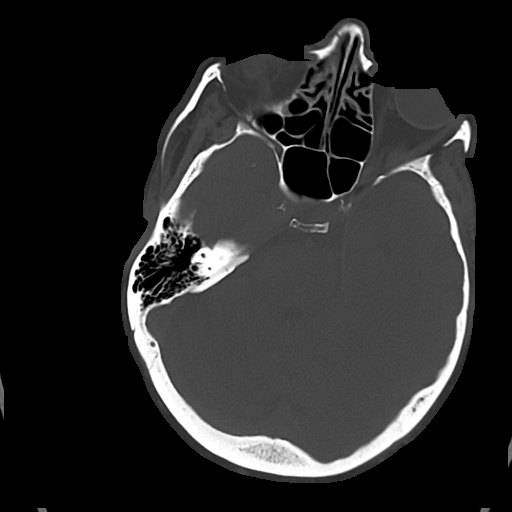

[Series 5: cor soft · coronal · 0.30mm/px · 3 of 69 slices shown]
[im 23/69  brain]
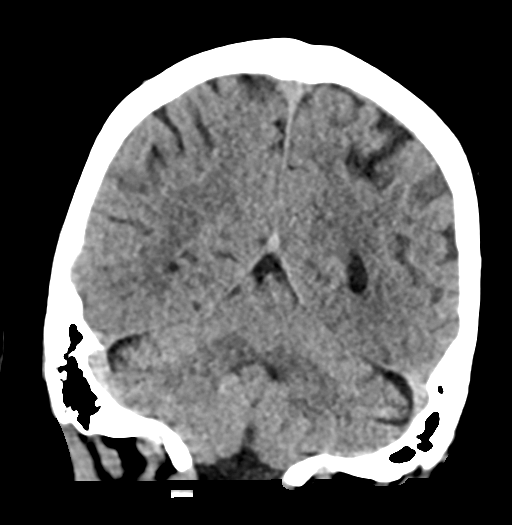
[im 31/69  brain]
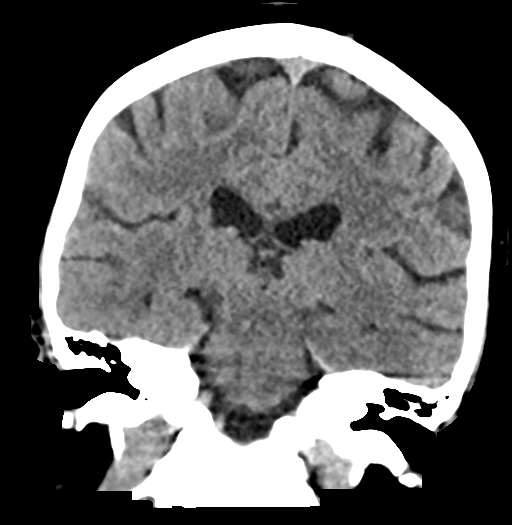
[im 38/69  brain]
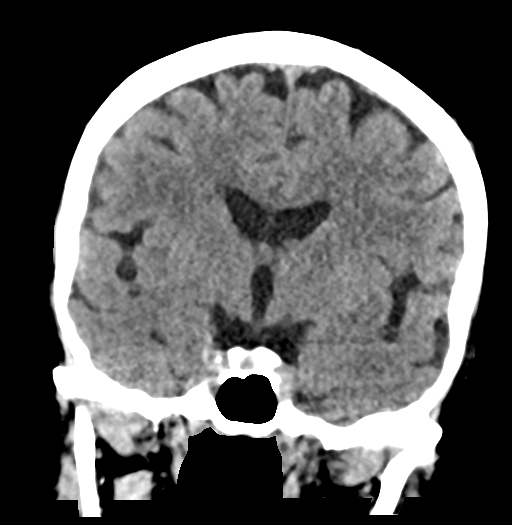

[Series 6: sag soft · sagittal · 0.30mm/px · 3 of 58 slices shown]
[im 20/58  brain]
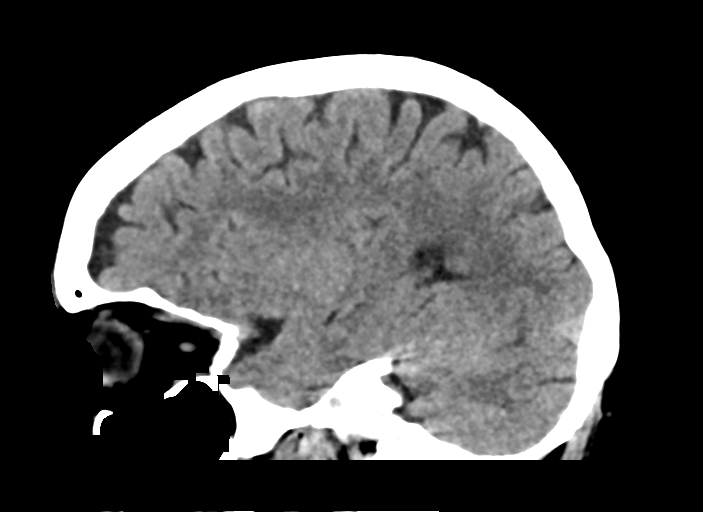
[im 29/58  brain]
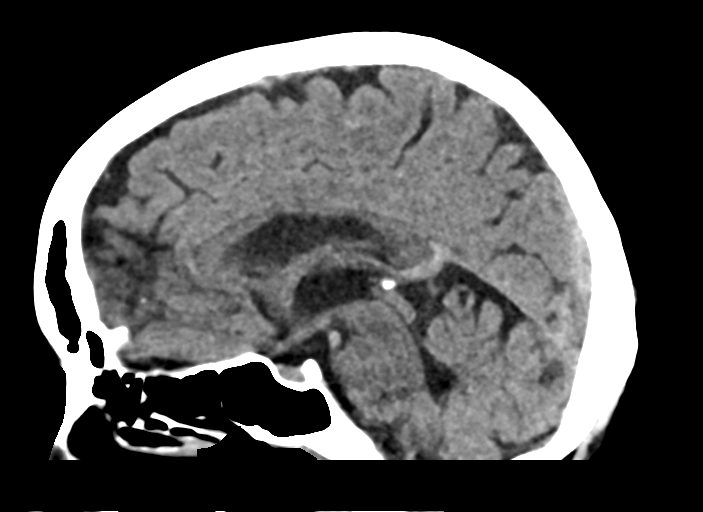
[im 39/58  brain]
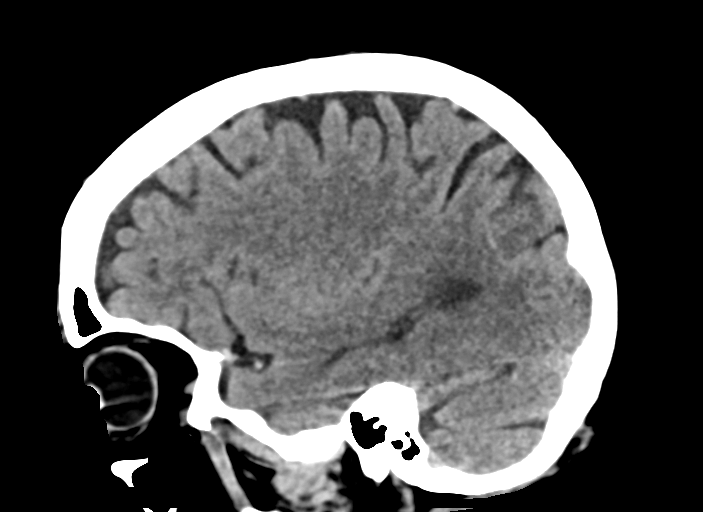

[16 of 47 positions shown; findings below may reference images not displayed]

FINDINGS: Brain: Subtle hypodensity at the left occipital lobe consistent with
an evolving left PCA territory infarct. No acute intracranial
hemorrhage. No other acute large vessel territory infarct. No mass
lesion or midline shift. No hydrocephalus or extra-axial fluid
collection.

Vascular: No hyperdense vessel.

Skull: Scalp soft tissues and calvarium within normal limits.

Sinuses/Orbits: Globes and orbital soft tissues within normal
limits. Paranasal sinuses and mastoid air cells are clear.

Other: None.

ASPECTS (Alberta Stroke Program Early CT Score)

- Ganglionic level infarction (caudate, lentiform nuclei, internal
capsule, insula, M1-M3 cortex): 7

- Supraganglionic infarction (M4-M6 cortex): 3

Total score (0-10 with 10 being normal): 10
IMPRESSION: 1. Subtle hypodensity involving the left occipital lobe, consistent
with an evolving left PCA distribution infarct. No intracranial
hemorrhage.
2. ASPECTS is 10.

These results were communicated to Dr. Aujla at [DATE] on
03/29/2021 by text page via the [employer] messaging system.

## 2023-01-19 ENCOUNTER — Encounter: Payer: Self-pay | Admitting: Gastroenterology

## 2023-04-27 ENCOUNTER — Ambulatory Visit: Payer: Medicare Other | Admitting: Gastroenterology

## 2024-01-27 ENCOUNTER — Emergency Department (HOSPITAL_COMMUNITY)

## 2024-01-27 ENCOUNTER — Encounter (HOSPITAL_COMMUNITY): Payer: Self-pay | Admitting: Emergency Medicine

## 2024-01-27 ENCOUNTER — Other Ambulatory Visit: Payer: Self-pay

## 2024-01-27 ENCOUNTER — Emergency Department (HOSPITAL_COMMUNITY)
Admission: EM | Admit: 2024-01-27 | Discharge: 2024-01-27 | Disposition: A | Discharge status: Home / Self Care | Attending: Emergency Medicine | Admitting: Emergency Medicine

## 2024-01-27 DIAGNOSIS — R112 Nausea with vomiting, unspecified: Secondary | ICD-10-CM | POA: Diagnosis not present

## 2024-01-27 DIAGNOSIS — R0602 Shortness of breath: Secondary | ICD-10-CM | POA: Insufficient documentation

## 2024-01-27 DIAGNOSIS — Z7982 Long term (current) use of aspirin: Secondary | ICD-10-CM | POA: Diagnosis not present

## 2024-01-27 DIAGNOSIS — R7989 Other specified abnormal findings of blood chemistry: Secondary | ICD-10-CM | POA: Insufficient documentation

## 2024-01-27 DIAGNOSIS — Z8673 Personal history of transient ischemic attack (TIA), and cerebral infarction without residual deficits: Secondary | ICD-10-CM | POA: Diagnosis not present

## 2024-01-27 DIAGNOSIS — Z7901 Long term (current) use of anticoagulants: Secondary | ICD-10-CM | POA: Diagnosis not present

## 2024-01-27 DIAGNOSIS — R1013 Epigastric pain: Secondary | ICD-10-CM | POA: Insufficient documentation

## 2024-01-27 LAB — COMPREHENSIVE METABOLIC PANEL WITH GFR
ALT: 19 U/L (ref 0–44)
AST: 37 U/L (ref 15–41)
Albumin: 4.2 g/dL (ref 3.5–5.0)
Alkaline Phosphatase: 77 U/L (ref 38–126)
Anion gap: 16 — ABNORMAL HIGH (ref 5–15)
BUN: 15 mg/dL (ref 8–23)
CO2: 18 mmol/L — ABNORMAL LOW (ref 22–32)
Calcium: 9.7 mg/dL (ref 8.9–10.3)
Chloride: 99 mmol/L (ref 98–111)
Creatinine, Ser: 1.72 mg/dL — ABNORMAL HIGH (ref 0.44–1.00)
GFR, Estimated: 30 mL/min — ABNORMAL LOW (ref >60)
Glucose, Bld: 186 mg/dL — ABNORMAL HIGH (ref 70–99)
Potassium: 4.9 mmol/L (ref 3.5–5.1)
Sodium: 133 mmol/L — ABNORMAL LOW (ref 135–145)
Total Bilirubin: 1.3 mg/dL — ABNORMAL HIGH (ref 0.0–1.2)
Total Protein: 7.4 g/dL (ref 6.5–8.1)

## 2024-01-27 LAB — CBC
HCT: 41.6 % (ref 36.0–46.0)
Hemoglobin: 13.7 g/dL (ref 12.0–15.0)
MCH: 26.8 pg (ref 26.0–34.0)
MCHC: 32.9 g/dL (ref 30.0–36.0)
MCV: 81.3 fL (ref 80.0–100.0)
Platelets: 267 K/uL (ref 150–400)
RBC: 5.12 MIL/uL — ABNORMAL HIGH (ref 3.87–5.11)
RDW: 13.8 % (ref 11.5–15.5)
WBC: 11 K/uL — ABNORMAL HIGH (ref 4.0–10.5)
nRBC: 0 % (ref 0.0–0.2)

## 2024-01-27 LAB — TROPONIN I (HIGH SENSITIVITY)
Troponin I (High Sensitivity): 77 ng/L — ABNORMAL HIGH (ref <18)
Troponin I (High Sensitivity): 73 ng/L — ABNORMAL HIGH (ref <18)
Troponin I (High Sensitivity): 71 ng/L — ABNORMAL HIGH (ref <18)

## 2024-01-27 LAB — I-STAT CHEM 8, ED
BUN: 16 mg/dL (ref 8–23)
Calcium, Ion: 1.1 mmol/L — ABNORMAL LOW (ref 1.15–1.40)
Chloride: 100 mmol/L (ref 98–111)
Creatinine, Ser: 1.7 mg/dL — ABNORMAL HIGH (ref 0.44–1.00)
Glucose, Bld: 188 mg/dL — ABNORMAL HIGH (ref 70–99)
HCT: 43 % (ref 36.0–46.0)
Hemoglobin: 14.6 g/dL (ref 12.0–15.0)
Potassium: 4.7 mmol/L (ref 3.5–5.1)
Sodium: 133 mmol/L — ABNORMAL LOW (ref 135–145)
TCO2: 18 mmol/L — ABNORMAL LOW (ref 22–32)

## 2024-01-27 LAB — LIPASE, BLOOD: Lipase: 48 U/L (ref 11–51)

## 2024-01-27 MED ORDER — SODIUM CHLORIDE 0.9 % IV BOLUS
1000.0000 mL | Freq: Once | INTRAVENOUS | Status: AC
  Administered 2024-01-27: 1000 mL via INTRAVENOUS

## 2024-01-27 MED ORDER — METOCLOPRAMIDE HCL 10 MG PO TABS
10.0000 mg | ORAL_TABLET | Freq: Four times a day (QID) | ORAL | 0 refills | Status: AC | PRN
Start: 2024-01-27 — End: ?

## 2024-01-27 MED ORDER — FENTANYL CITRATE PF 50 MCG/ML IJ SOSY
50.0000 ug | PREFILLED_SYRINGE | Freq: Once | INTRAMUSCULAR | Status: AC
  Administered 2024-01-27: 50 ug via INTRAVENOUS
  Filled 2024-01-27: qty 1

## 2024-01-27 MED ORDER — METOCLOPRAMIDE HCL 5 MG/ML IJ SOLN
5.0000 mg | Freq: Once | INTRAMUSCULAR | Status: AC
  Administered 2024-01-27: 5 mg via INTRAVENOUS
  Filled 2024-01-27: qty 2

## 2024-01-27 MED ORDER — PANTOPRAZOLE SODIUM 40 MG IV SOLR
40.0000 mg | Freq: Once | INTRAVENOUS | Status: AC
  Administered 2024-01-27: 40 mg via INTRAVENOUS
  Filled 2024-01-27: qty 10

## 2024-01-27 MED ORDER — METOCLOPRAMIDE HCL 5 MG/ML IJ SOLN
10.0000 mg | Freq: Once | INTRAMUSCULAR | Status: DC
  Filled 2024-01-27: qty 2

## 2024-01-27 NOTE — ED Provider Notes (Signed)
 [facility] Provider Note   CSN: [id number] Arrival date & time: 01/27/24  1328     Patient presents with: Abdominal Pain   Julie Ballard is a 78 y.o. female who presents emergency department with a chief complaint of abdominal pain.  She has a history of memory deficit and history is given mostly by her family member at bedside.  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  And history of esophagitis.  He reports she has been complaining intermittent epigastric abdominal pain across both upper quadrants with associated nausea and heartburn and intermittent vomiting for several months.  Over the past few days her symptoms have worsened and today she suddenly had onset of shortness of breath which has resolved at this time.  He does report that she has not been eating well or taking her medications over the past several days so he did give her dose of Eliquis today but is not sure if she is taking it in the past couple of days which she blames on her memory issues.  She has no pain at this time and denies any nausea or vomiting.    Abdominal Pain      Prior to Admission medications   Medication Sig Start Date End Date Taking? Authorizing Provider  metoCLOPramide  (REGLAN ) 10 MG tablet Take 1 tablet (10 mg total) by mouth every 6 (six) hours as needed for nausea, vomiting or refractory nausea / vomiting (nausea/headache). 01/27/24  Yes Jaimi Belle, PA-C  aspirin 81 MG tablet Take 81 mg by mouth 2 (two) times daily.     [provider]  cyclobenzaprine (FLEXERIL) 10 MG tablet Take 10 mg by mouth 3 (three) times daily as needed for muscle spasms.     [provider]  dicyclomine (BENTYL) 10 MG capsule Take 10 mg by mouth every 4 (four) hours as needed for spasms.     [provider]  ergocalciferol  (VITAMIN D2) 50000 UNITS capsule Take 50,000 Units by mouth every 14 (fourteen) days.     [provider]  estrogens-methylTEST (ESTRATEST) 1.25-2.5 MG tablet Take 1 tablet by mouth daily.    [provider]  fluocinonide (LIDEX) 0.05 % external solution Apply 1 application topically daily.    [provider]  gabapentin (NEURONTIN) 300 MG capsule Take 300-600 mg by mouth See admin instructions. Take 300 mg by mouth twice daily and take 600 mg by mouth at bedtime    [provider]  hydrochlorothiazide 25 MG tablet Take 25 mg by mouth 3 (three) times daily.     [provider]  ketoconazole (NIZORAL) 2 % shampoo Apply 1 application topically every 3 (three) days.    [provider]  levothyroxine (SYNTHROID, LEVOTHROID) 50 MCG tablet Take 50 mcg by mouth daily before breakfast.     [provider]  LORazepam (ATIVAN) 1 MG tablet Take 1 mg by mouth See admin instructions. Take 1 mg by mouth up to 5 times daily as needed for tremors    [provider]  meperidine  (DEMEROL ) 50 MG tablet Take 50 mg by mouth every 4 (four) hours as needed for moderate pain.     [provider]  meperidine  (DEMEROL ) 50 MG/ML injection Inject 1 mL (50 mg total) into the muscle every 4 (four) hours as needed for moderate pain. Patient not taking: Reported  on 08/19/2016 09/17/12   Watt Rush, MD  Multiple Vitamins-Minerals (MULTIVITAMIN PO) Take 1 tablet by mouth daily.    [provider]  nitroGLYCERIN (NITROSTAT) 0.4 MG SL tablet Place 0.4 mg under the tongue every 5 (five) minutes as needed for chest pain.    [provider]  pantoprazole  (PROTONIX ) 40 MG tablet Take 40 mg by mouth daily.    [provider]  phenazopyridine  (PYRIDIUM ) 200 MG tablet Take 1 tablet (200 mg total) by mouth 3 (three) times daily as needed for pain. Patient not taking: Reported on 08/19/2016 09/17/12   Wrenn, John, MD  potassium chloride SA  (K-DUR,KLOR-CON) 20 MEQ tablet Take 20 mEq by mouth daily.    [provider]  promethazine (PHENERGAN) 25 MG tablet Take 25 mg by mouth every 6 (six) hours as needed (TAKES W/ DEMEROL ).     [provider]  propranolol (INDERAL) 40 MG tablet Take 40 mg by mouth 2 (two) times daily.     [provider]  simvastatin (ZOCOR) 40 MG tablet Take 40 mg by mouth daily.    [provider]  tolterodine (DETROL LA) 4 MG 24 hr capsule Take 4 mg by mouth daily.     [provider]  vitamin B-12 (CYANOCOBALAMIN) 1000 MCG tablet Take 1,000 mcg by mouth daily.     [provider]  zolpidem (AMBIEN) 10 MG tablet Take 10 mg by mouth at bedtime.     [provider]    Allergies: Sulfa drugs cross reactors, Abilify [aripiprazole], Biaxin [clarithromycin], Codeine, Cymbalta [duloxetine hcl], Doxycycline, Elavil [amitriptyline hcl], Erythromycin, Halcion [triazolam], Hydrocodone, Hydromorphone , Lamisil [terbinafine], Lipitor [atorvastatin], Morphine  and codeine, Mysoline [primidone], Other, Penicillins, Pravachol [pravastatin], Pseudoephedrine hcl, Vytorin [ezetimibe-simvastatin], Zofran , Zofran  [ondansetron  hcl], and Adhesive [tape]    Review of Systems  Gastrointestinal:  Positive for abdominal pain.    Updated Vital Signs BP (!) 158/79   Pulse 62   Temp 98.4 F (36.9 C) (Oral)   Resp 18   SpO2 99%   Physical Exam Vitals and nursing note reviewed.  Constitutional:      General: She is not in acute distress.    Appearance: She is well-developed. She is not diaphoretic.  HENT:     Head: Normocephalic and atraumatic.     Right Ear: External ear normal.     Left Ear: External ear normal.     Nose: Nose normal.     Mouth/Throat:     Mouth: Mucous membranes are moist.  Eyes:     General: No scleral icterus.    Conjunctiva/sclera: Conjunctivae normal.  Cardiovascular:     Rate and Rhythm: Normal rate and regular rhythm.     Heart sounds:  Normal heart sounds. No murmur heard.    No friction rub. No gallop.  Pulmonary:     Effort: Pulmonary effort is normal. No respiratory distress.     Breath sounds: Normal breath sounds.  Abdominal:     General: Bowel sounds are normal. There is no distension.     Palpations: Abdomen is soft. There is no mass.     Tenderness: There is no abdominal tenderness. There is no guarding.  Musculoskeletal:     Cervical back: Normal range of motion.     Right lower leg: No edema.     Left lower leg: No edema.  Skin:    General: Skin is warm and dry.  Neurological:     Mental Status: She is alert and oriented to person,  place, and time.  Psychiatric:        Behavior: Behavior normal.     (all labs ordered are listed, but only abnormal results are displayed) Labs Reviewed  CBC - Abnormal; Notable for the following components:      Result Value   WBC 11.0 (*)    RBC 5.12 (*)    All other components within normal limits  COMPREHENSIVE METABOLIC PANEL WITH GFR - Abnormal; Notable for the following components:   Sodium 133 (*)    CO2 18 (*)    Glucose, Bld 186 (*)    Creatinine, Ser 1.72 (*)    Total Bilirubin 1.3 (*)    GFR, Estimated 30 (*)    Anion gap 16 (*)    All other components within normal limits  I-STAT CHEM 8, ED - Abnormal; Notable for the following components:   Sodium 133 (*)    Creatinine, Ser 1.70 (*)    Glucose, Bld 188 (*)    Calcium, Ion 1.10 (*)    TCO2 18 (*)    All other components within normal limits  TROPONIN I (HIGH SENSITIVITY) - Abnormal; Notable for the following components:   Troponin I (High Sensitivity) 77 (*)    All other components within normal limits  TROPONIN I (HIGH SENSITIVITY) - Abnormal; Notable for the following components:   Troponin I (High Sensitivity) 73 (*)    All other components within normal limits  TROPONIN I (HIGH SENSITIVITY) - Abnormal; Notable for the following components:   Troponin I (High Sensitivity) 71 (*)    All other  components within normal limits  LIPASE, BLOOD  URINALYSIS, W/ REFLEX TO CULTURE (INFECTION SUSPECTED)    EKG: EKG Interpretation Date/Time:  Sunday January 27 2024 13:47:19 EDT Ventricular Rate:  62 PR Interval:  124 QRS Duration:  70 QT Interval:  500 QTC Calculation: 507 R Axis:   59  Text Interpretation: Normal sinus rhythm  nonspecific st t abnormality, new compared to last When compared with ECG of 19-Aug-2016 13:32, Confirmed by Randol Simmonds [phone]) on 01/27/2024 4:07:29 PM  Radiology: CT ABDOMEN PELVIS WO CONTRAST Result Date: 01/27/2024 EXAM: CT ABDOMEN AND PELVIS WITHOUT CONTRAST 01/27/2024 07:44:13 PM TECHNIQUE: CT of the abdomen and pelvis was performed without the administration of intravenous contrast. Multiplanar reformatted images are provided for review. Automated exposure control, iterative reconstruction, and/or weight-based adjustment of the mA/kV was utilized to reduce the radiation dose to as low as reasonably achievable. COMPARISON: 07/18/2016 CLINICAL HISTORY: LUQ abdominal pain. FINDINGS: LOWER CHEST: Bibasilar atelectasis or scarring. LIVER: The liver is unremarkable. GALLBLADDER AND BILE DUCTS: Cholecystectomy. No biliary ductal dilatation. SPLEEN: No acute abnormality. PANCREAS: No acute abnormality. ADRENAL GLANDS: No acute abnormality. KIDNEYS, URETERS AND BLADDER: No stones in the kidneys or ureters. No hydronephrosis. No perinephric or periureteral stranding. Urinary bladder is unremarkable. GI AND BOWEL: Stomach demonstrates no acute abnormality. There is no bowel obstruction. PERITONEUM AND RETROPERITONEUM: No ascites. No free air. VASCULATURE: Aortic atherosclerotic calcification. LYMPH NODES: No lymphadenopathy. REPRODUCTIVE ORGANS: Hysterectomy. BONES AND SOFT TISSUES: No acute osseous abnormality. No focal soft tissue abnormality. IMPRESSION: 1. No acute findings in the abdomen or pelvis. Electronically signed by: Norman Gatlin MD 01/27/2024 07:55 PM EDT RP  Workstation: [id number]   DG Chest 2 View Result Date: 01/27/2024 CLINICAL DATA:  Chest pain EXAM: CHEST - 2 VIEW COMPARISON:  Chest radiograph dated 08/19/2016 FINDINGS: Patient is rotated slightly to the left. Normal lung volumes. Bibasilar linear opacities. No pleural effusion or pneumothorax. The  heart size and mediastinal contours are within normal limits. No acute osseous abnormality. IMPRESSION: Bibasilar linear opacities, likely atelectasis. Electronically Signed   By: Limin  Xu M.D.   On: 01/27/2024 15:23     Procedures   Medications Ordered in the ED  metoCLOPramide  (REGLAN ) injection 10 mg (10 mg Intravenous Not Given 01/27/24 1951)  sodium chloride  0.9 % bolus 1,000 mL (0 mLs Intravenous Stopped 01/27/24 2010)  pantoprazole  (PROTONIX ) injection 40 mg (40 mg Intravenous Given 01/27/24 1802)  metoCLOPramide  (REGLAN ) injection 5 mg (5 mg Intravenous Given 01/27/24 1801)  fentaNYL  (SUBLIMAZE ) injection 50 mcg (50 mcg Intravenous Given 01/27/24 1858)    Clinical Course as of 01/27/24 2343  Sun Jan 27, 2024  1849 Patient reevaluated at bedside now retching complaining of severe abdominal pain I have ordered CT abdomen pelvis without contrast given her renal insufficiency [AH]    Clinical Course User Index [AH] Arloa Chroman, PA-C                                 Medical Decision Making Amount and/or Complexity of Data Reviewed Labs: ordered. Radiology: ordered.  Risk Prescription drug management.   This patient presents to the ED for concern of epigastric pain and vomiting + transient sob, this involves an extensive number of treatment options, and is a complaint that carries with it a high risk of complications and morbidity.  Differential diagnosis of epigastric pain includes: PUD, GERD, Gastritis,pancreatitis,gastroparesis, abdominal cancers biliary disease or obstruction, ACS, pericarditis, pneumonia, abdominal hernia, intestinal ischemia, esophageal rupture, gastric volvulus,  hepatitis.   Co morbidities:   As per HPI  Social Determinants of Health:   SDOH Screenings   Food Insecurity: Low Risk  (04/03/2023)   Received from [facility]  Housing: Low Risk  (04/03/2023)   Received from [facility]  Transportation Needs: No Transportation Needs (04/03/2023)   Received from [facility]  Utilities: Low Risk  (04/03/2023)   Received from [facility]  Financial Resource Strain: Low Risk  (05/27/2022)   Received from [facility]  Physical Activity: Unknown (05/27/2022)   Received from [facility]  Social Connections: Somewhat Isolated (05/27/2022)   Received from [facility]  Stress: Stress Concern Present (05/27/2022)   Received from [facility]  Tobacco Use: High Risk (01/27/2024)     Additional history:  {Additional history obtained from    Lab Tests:  I Ordered, and personally interpreted labs.  The pertinent results include:   3:51 PM Labs reviewed.  Patient's CMP with glucose 186, creatinine 1.72 up from 1.4 on 10/23/2023 and outpatient records WBC 11, normal hemoglobin Lipase within normal limits Troponin is elevated at 77.  Second is pending at this time   Imaging Studies:  I ordered imaging studies  two-view chest x-ray, CT abdomen and pelvis without contrast I independently visualized and interpreted imaging which showed no acute finding I agree with the radiologist interpretation  Cardiac Monitoring/ECG:  The patient was maintained on a cardiac monitor.  I personally viewed and interpreted the cardiac monitored which showed an underlying rhythm of:   Normal sinus rhythm at a rate of 60     Medicines ordered and prescription drug management:  I ordered medication including  Medications  metoCLOPramide  (REGLAN ) injection 10 mg (10 mg Intravenous Not Given 01/27/24 1951)  sodium chloride  0.9 % bolus 1,000 mL (0 mLs Intravenous Stopped 01/27/24 2010)  pantoprazole  (PROTONIX ) injection 40 mg (40 mg Intravenous Given  01/27/24  1802)  metoCLOPramide  (REGLAN ) injection 5 mg (5 mg Intravenous Given 01/27/24 1801)  fentaNYL  (SUBLIMAZE ) injection 50 mcg (50 mcg Intravenous Given 01/27/24 1858)   for  abdominal pain nausea  Reevaluation of the patient after these medicines showed that the patient resolved I have reviewed the patients home medicines and have made adjustments as needed  Test Considered:   considered a CT angiogram of the chest PE study however patient is not having tachycardia hypotension hypoxia or any symptoms of shortness of breath or chest pain here in the emergency department  Critical Interventions:    Consultations Obtained:   Problem List / ED Course:     ICD-10-CM   1. SOB (shortness of breath)  R06.02     2. Epigastric pain  R10.13     3. Nausea and vomiting, unspecified vomiting type  R11.2       MDM: 78 year old female with a history of dementia, came in for shortness of breath abdominal pain which appears to be chronic.  Patient received 3 troponin levels over period of 8 hours.  They are slightly elevated but stable and review of outside EMR shows that they have been elevated in the past.  I do not think that her symptoms are representative of ACS. Patient abdominal CT also negative for acute finding.  Her symptoms are currently resolved.  Seen in shared visit with Dr. Randol agrees with plan for discharge at this time.  Will have the patient follow closely with her primary care physician.   Dispostion:  After consideration of the diagnostic results and the patients response to treatment, I feel that the patent would benefit from discharge with strict return precautions and return precautions.      Final diagnoses:  SOB (shortness of breath)  Epigastric pain  Nausea and vomiting, unspecified vomiting type    ED Discharge Orders          Ordered    metoCLOPramide  (REGLAN ) 10 MG tablet  Every 6 hours PRN        01/27/24 2151               Arloa Chroman,  PA-C 01/27/24 2356    Randol Simmonds, MD 01/28/24 1024

## 2024-01-27 NOTE — ED Provider Notes (Incomplete)
 I provided a substantive portion of the care of this patient.  I personally made/approved the management plan for this patient and take responsibility for the patient management. {Remember to document shared critical care using edcritical dot phrase:1} EKG Interpretation Date/Time:  Sunday January 27 2024 13:47:19 EDT Ventricular Rate:  62 PR Interval:  124 QRS Duration:  70 QT Interval:  500 QTC Calculation: 507 R Axis:   59  Text Interpretation: Normal sinus rhythm  nonspecific st t abnormality, new compared to last When compared with ECG of 19-Aug-2016 13:32, Confirmed by Randol Simmonds [phone]) on 01/27/2024 4:07:29 PM

## 2024-01-27 NOTE — Discharge Instructions (Signed)
 You were seen in the emergency department for your shortness of breath nausea and vomiting.  You received a workup for your heart.  Your cardiac enzyme was mildly elevated which has been seen in the past but did not change over the 8 hours you were in the emergency department which does not suggest that you are having any type of heart attack. Secondly we did a CT of your abdomen and pelvis which showed no acute abnormalities.  You will need to follow closely with your primary care for further evaluation.  I have sent in medication for nausea and vomiting called Reglan .  You can take this medication 1 tablet every 6 hours as needed for nausea and vomiting.  Get help right away if: You have pain in your chest, neck, arm, or jaw. You feel extremely weak or you faint. You have persistent vomiting. You have vomit that is bright red or looks like black coffee grounds. You have bloody or black stools (feces) or stools that look like tar. You have a severe headache, a stiff neck, or both. You have severe pain, cramping, or bloating in your abdomen. You have difficulty breathing, or you are breathing very quickly. Your heart is beating very quickly. Your skin feels cold and clammy. You feel confused. You have signs of dehydration, such as: Dark urine, very little urine, or no urine. Cracked lips. Dry mouth. Sunken eyes. Sleepiness. Weakness. These symptoms may be an emergency. Get help right away. Call 911. Do not wait to see if the symptoms will go away. Do not drive yourself to the hospital..eddcv

## 2024-01-27 NOTE — ED Triage Notes (Signed)
 PT came in for 5/10 upper abd pain x 1 month. Became SOB this morning prompting this visit.  Pt has not had any daily meds today.

## 2024-05-12 ENCOUNTER — Ambulatory Visit: Admitting: Physician Assistant

## 2024-05-12 NOTE — Progress Notes (Deleted)
 "    05/12/2024 Stormi Vandevelde [id number] 1945-06-13  Referring provider: Lazoff, Shawn P, DO Primary GI doctor: Dr. Charlanne  ASSESSMENT AND PLAN:  IBS Some sort of small intestine surgery History of urinary incontinence  GERD Status post cholecystectomy On Protonix  40 mg ER visit 01/27/2024 with upper abdominal pain nausea reflux intermittent vomiting, elevated troponins found to be in acute renal failure elevated white blood cell count normal lipase.  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 female with a past medical history listed below presents for evaluation of ***.   Appears patient was remotely seen by Dr. Donnald in 1999  *** Discussed the use of AI scribe software for clinical note transcription with the patient, who gave verbal consent to proceed.  History of Present Illness            She  reports that she has been smoking cigarettes. She has a 10 pack-year smoking history. She has never used smokeless tobacco. She reports that she does not drink alcohol and does not use drugs.  RELEVANT GI HISTORY, IMAGING AND LABS: Results          CBC    Component Value Date/Time   WBC 11.0 (H) 01/27/2024 1356   RBC 5.12 (H) 01/27/2024 1356   HGB 14.6 01/27/2024 1417   HCT 43.0 01/27/2024 1417   PLT 267 01/27/2024 1356   MCV 81.3 01/27/2024 1356   MCH 26.8 01/27/2024 1356   MCHC 32.9 01/27/2024 1356   RDW 13.8 01/27/2024 1356   LYMPHSABS 1.7 03/29/2021 2152   MONOABS 0.7 03/29/2021 2152   EOSABS 0.5 03/29/2021 2152   BASOSABS 0.1 03/29/2021 2152   Recent Labs    01/27/24 1356 01/27/24 1417  HGB 13.7 14.6    CMP     Component Value Date/Time   NA 133 (L) 01/27/2024 1417   K 4.7 01/27/2024 1417   CL 100 01/27/2024 1417   CO2 18 (L)  01/27/2024 1356   GLUCOSE 188 (H) 01/27/2024 1417   BUN 16 01/27/2024 1417   CREATININE 1.70 (H) 01/27/2024 1417   CALCIUM 9.7 01/27/2024 1356   PROT 7.4 01/27/2024 1356   ALBUMIN 4.2 01/27/2024 1356   AST 37 01/27/2024 1356   ALT 19 01/27/2024 1356   ALKPHOS 77 01/27/2024 1356   BILITOT 1.3 (H) 01/27/2024 1356   GFRNONAA 30 (L) 01/27/2024 1356   GFRAA >60 08/19/2016 1427      Latest Ref Rng & Units 01/27/2024    1:56 PM 03/29/2021    9:52 PM  Hepatic Function  Total Protein 6.5 - 8.1 g/dL 7.4  6.4   Albumin 3.5 - 5.0 g/dL 4.2  3.6   AST 15 - 41 U/L 37  23   ALT 0 - 44 U/L 19  15   Alk Phosphatase 38 - 126 U/L 77  57   Total Bilirubin 0.0 - 1.2 mg/dL 1.3  0.8       Current Medications:   Current Outpatient Medications (Endocrine & Metabolic):    estrogens-methylTEST (ESTRATEST) 1.25-2.5 MG tablet, Take 1 tablet by mouth daily.   levothyroxine (SYNTHROID, LEVOTHROID) 50 MCG tablet, Take 50 mcg by mouth daily before breakfast.   Current Outpatient Medications (Cardiovascular):    hydrochlorothiazide 25 MG tablet, Take 25 mg by mouth  3 (three) times daily.    nitroGLYCERIN (NITROSTAT) 0.4 MG SL tablet, Place 0.4 mg under the tongue every 5 (five) minutes as needed for chest pain.   propranolol (INDERAL) 40 MG tablet, Take 40 mg by mouth 2 (two) times daily.    simvastatin (ZOCOR) 40 MG tablet, Take 40 mg by mouth daily.  Current Outpatient Medications (Respiratory):    promethazine (PHENERGAN) 25 MG tablet, Take 25 mg by mouth every 6 (six) hours as needed (TAKES W/ DEMEROL ).   Current Outpatient Medications (Analgesics):    aspirin 81 MG tablet, Take 81 mg by mouth 2 (two) times daily.    meperidine  (DEMEROL ) 50 MG tablet, Take 50 mg by mouth every 4 (four) hours as needed for moderate pain.    meperidine  (DEMEROL ) 50 MG/ML injection, Inject 1 mL (50 mg total) into the muscle every 4 (four) hours as needed for moderate pain. (Patient not taking: Reported on  08/19/2016)  Facility-Administered Medications Ordered in Other Visits (Analgesics):    bupivacaine  (MARCAINE ) 0.5 % 15 mL, phenazopyridine  (PYRIDIUM ) 400 mg bladder mixture*  Current Outpatient Medications (Hematological):    vitamin B-12 (CYANOCOBALAMIN) 1000 MCG tablet, Take 1,000 mcg by mouth daily.   Current Outpatient Medications (Other):    cyclobenzaprine (FLEXERIL) 10 MG tablet, Take 10 mg by mouth 3 (three) times daily as needed for muscle spasms.    dicyclomine (BENTYL) 10 MG capsule, Take 10 mg by mouth every 4 (four) hours as needed for spasms.    ergocalciferol (VITAMIN D2) 50000 UNITS capsule, Take 50,000 Units by mouth every 14 (fourteen) days.    fluocinonide (LIDEX) 0.05 % external solution, Apply 1 application topically daily.   gabapentin (NEURONTIN) 300 MG capsule, Take 300-600 mg by mouth See admin instructions. Take 300 mg by mouth twice daily and take 600 mg by mouth at bedtime   ketoconazole (NIZORAL) 2 % shampoo, Apply 1 application topically every 3 (three) days.   LORazepam (ATIVAN) 1 MG tablet, Take 1 mg by mouth See admin instructions. Take 1 mg by mouth up to 5 times daily as needed for tremors   metoCLOPramide  (REGLAN ) 10 MG tablet, Take 1 tablet (10 mg total) by mouth every 6 (six) hours as needed for nausea, vomiting or refractory nausea / vomiting (nausea/headache).   Multiple Vitamins-Minerals (MULTIVITAMIN PO), Take 1 tablet by mouth daily.   pantoprazole  (PROTONIX ) 40 MG tablet, Take 40 mg by mouth daily.   phenazopyridine  (PYRIDIUM ) 200 MG tablet, Take 1 tablet (200 mg total) by mouth 3 (three) times daily as needed for pain. (Patient not taking: Reported on 08/19/2016)   potassium chloride SA (K-DUR,KLOR-CON) 20 MEQ tablet, Take 20 mEq by mouth daily.   tolterodine (DETROL LA) 4 MG 24 hr capsule, Take 4 mg by mouth daily.    zolpidem (AMBIEN) 10 MG tablet, Take 10 mg by mouth at bedtime.   Facility-Administered Medications Ordered in Other Visits (Other):     bupivacaine  (MARCAINE ) 0.5 % 15 mL, phenazopyridine  (PYRIDIUM ) 400 mg bladder mixture*  * These medications belong to multiple therapeutic classes and are listed under each applicable group.  No current facility-administered medications for this visit.  Medical History:  Past Medical History:  Diagnosis Date   Anxiety    Chronic pain    Complication of anesthesia SEVERE BRADYCARDIA WHEN TAKES BETA BLOCKERS PRIOR TO SURGERY   ANES. RECORD REQUESTED FROM 2004 [facility])   DDD (degenerative disc disease)    Depression    DJD (degenerative joint disease)  Fibromyalgia    SEVERE   Frequency of urination    GAD (generalized anxiety disorder)    GERD (gastroesophageal reflux disease)    History of atrial fibrillation    History of CHF (congestive heart failure)    CARDIOLOGIST ---  DR JORDAN   History of diverticulitis of colon    History of seizures    GESTATIONAL SEIZURES--  NONE SINCE   History of syncope    Hyperlipidemia    Hypertension    Hypothyroidism (acquired)    IBS (irritable bowel syndrome)    IBS (irritable bowel syndrome)    Insomnia    Low back pain    Nocturia    Thyroid cyst    Tremor    Tremor, essential    TAKES ATIVAN   Vitamin D deficiency    Allergies: Allergies[1]   Surgical History:  She  has a past surgical history that includes Esophagogastroduodenoscopy (10/05/2010); Sigmoid resection / rectopexy (05/23/1991); Hemorroidectomy (LAST ONE 1995); Cardiovascular stress test (12-22-2010  DR JORDAN); Vaginal hysterectomy (AGE 89); Bilateral salpingoophorectomy (05/22/2002); Tonsillectomy and adenoidectomy (AGE 89); Cholecystectomy (05/22/2002); Cardiac catheterization (09/2000  FORSYTHE); CYSTO/ HOD/ INSTILLATION THERAPY (1999  &  2004); cysto with hydrodistension (N/A, 09/17/2012); Colonoscopy (05/08/2016); Esophagogastroduodenoscopy (04/20/2016); and Tonsillectomy and adenoidectomy. Family History:  Her family history includes Alcohol abuse in her  sister; Cirrhosis in her sister; Heart attack in her brother and father.  REVIEW OF SYSTEMS  : All other systems reviewed and negative except where noted in the History of Present Illness.  PHYSICAL EXAM: There were no vitals taken for this visit. Physical Exam          Alan JONELLE Coombs, PA-C 9:54 AM      [1]  Allergies Allergen Reactions   Sulfa Drugs Cross Reactors Hives, Swelling and Other (See Comments)    SEVERE   Abilify [Aripiprazole] Other (See Comments)    Unknown   Biaxin [Clarithromycin] Other (See Comments)    Unknown   Codeine Hives and Swelling   Cymbalta [Duloxetine Hcl] Nausea And Vomiting and Other (See Comments)    DILEARIUS   Doxycycline Hives and Swelling   Elavil [Amitriptyline Hcl] Other (See Comments)    MAKES ME GO CRAZY   Erythromycin Hives and Swelling   Halcion [Triazolam] Other (See Comments)    Unknown   Hydrocodone Other (See Comments)    Unknown   Hydromorphone  Nausea And Vomiting   Lamisil [Terbinafine] Other (See Comments)    Unknown   Lipitor [Atorvastatin] Other (See Comments)    Unknown, upset stomach   Morphine  And Codeine Nausea And Vomiting and Other (See Comments)    SEVERE   Mysoline [Primidone] Other (See Comments)    MAKES ME GO CRAZY   Other Other (See Comments)    Flu Vaccine- Sick for 3 months   Penicillins Hives and Swelling   Pravachol [Pravastatin] Other (See Comments)    Unknown   Pseudoephedrine Hcl Other (See Comments)    Unknown   Vytorin [Ezetimibe-Simvastatin] Other (See Comments)    Unknown   Zofran  Nausea And Vomiting and Other (See Comments)    SEVERE   Zofran  [Ondansetron  Hcl] Other (See Comments)    Unknown   Adhesive [Tape] Rash   "

## 2024-05-20 ENCOUNTER — Encounter: Payer: Self-pay | Admitting: Physician Assistant

## 2024-05-20 ENCOUNTER — Other Ambulatory Visit (INDEPENDENT_AMBULATORY_CARE_PROVIDER_SITE_OTHER)

## 2024-05-20 ENCOUNTER — Ambulatory Visit
Admission: RE | Admit: 2024-05-20 | Discharge: 2024-05-20 | Disposition: A | Discharge status: Home / Self Care | Source: Ambulatory Visit | Attending: Physician Assistant | Admitting: Physician Assistant

## 2024-05-20 ENCOUNTER — Ambulatory Visit: Admitting: Physician Assistant

## 2024-05-20 VITALS — BP 122/62 | HR 61 | Ht 66.0 in | Wt 139.1 lb

## 2024-05-20 DIAGNOSIS — Z9049 Acquired absence of other specified parts of digestive tract: Secondary | ICD-10-CM

## 2024-05-20 DIAGNOSIS — R194 Change in bowel habit: Secondary | ICD-10-CM | POA: Diagnosis not present

## 2024-05-20 DIAGNOSIS — R634 Abnormal weight loss: Secondary | ICD-10-CM

## 2024-05-20 DIAGNOSIS — R112 Nausea with vomiting, unspecified: Secondary | ICD-10-CM

## 2024-05-20 DIAGNOSIS — Z7901 Long term (current) use of anticoagulants: Secondary | ICD-10-CM | POA: Diagnosis not present

## 2024-05-20 DIAGNOSIS — D649 Anemia, unspecified: Secondary | ICD-10-CM | POA: Diagnosis not present

## 2024-05-20 DIAGNOSIS — K219 Gastro-esophageal reflux disease without esophagitis: Secondary | ICD-10-CM

## 2024-05-20 DIAGNOSIS — K589 Irritable bowel syndrome without diarrhea: Secondary | ICD-10-CM

## 2024-05-20 DIAGNOSIS — I480 Paroxysmal atrial fibrillation: Secondary | ICD-10-CM | POA: Diagnosis not present

## 2024-05-20 LAB — IBC + FERRITIN
Ferritin: 204.1 ng/mL (ref 10.0–291.0)
Iron: 54 ug/dL (ref 42–145)
Saturation Ratios: 24.7 % (ref 20.0–50.0)
TIBC: 218.4 ug/dL — ABNORMAL LOW (ref 250.0–450.0)
Transferrin: 156 mg/dL — ABNORMAL LOW (ref 212.0–360.0)

## 2024-05-20 LAB — COMPREHENSIVE METABOLIC PANEL WITH GFR
ALT: 15 U/L (ref 3–35)
AST: 34 U/L (ref 5–37)
Albumin: 3.9 g/dL (ref 3.5–5.2)
Alkaline Phosphatase: 66 U/L (ref 39–117)
BUN: 6 mg/dL (ref 6–23)
CO2: 27 meq/L (ref 19–32)
Calcium: 9 mg/dL (ref 8.4–10.5)
Chloride: 103 meq/L (ref 96–112)
Creatinine, Ser: 1.39 mg/dL — ABNORMAL HIGH (ref 0.40–1.20)
GFR: 36.79 mL/min — ABNORMAL LOW
Glucose, Bld: 104 mg/dL — ABNORMAL HIGH (ref 70–99)
Potassium: 3.1 meq/L — ABNORMAL LOW (ref 3.5–5.1)
Sodium: 141 meq/L (ref 135–145)
Total Bilirubin: 0.9 mg/dL (ref 0.2–1.2)
Total Protein: 6.6 g/dL (ref 6.0–8.3)

## 2024-05-20 LAB — CBC WITH DIFFERENTIAL/PLATELET
Basophils Absolute: 0 K/uL (ref 0.0–0.1)
Basophils Relative: 0.4 % (ref 0.0–3.0)
Eosinophils Absolute: 0.1 K/uL (ref 0.0–0.7)
Eosinophils Relative: 0.9 % (ref 0.0–5.0)
HCT: 34.7 % — ABNORMAL LOW (ref 36.0–46.0)
Hemoglobin: 11.5 g/dL — ABNORMAL LOW (ref 12.0–15.0)
Lymphocytes Relative: 12.5 % (ref 12.0–46.0)
Lymphs Abs: 1.3 K/uL (ref 0.7–4.0)
MCHC: 33.1 g/dL (ref 30.0–36.0)
MCV: 85.6 fl (ref 78.0–100.0)
Monocytes Absolute: 0.7 K/uL (ref 0.1–1.0)
Monocytes Relative: 6.8 % (ref 3.0–12.0)
Neutro Abs: 8.3 K/uL — ABNORMAL HIGH (ref 1.4–7.7)
Neutrophils Relative %: 79.4 % — ABNORMAL HIGH (ref 43.0–77.0)
Platelets: 214 K/uL (ref 150.0–400.0)
RBC: 4.06 Mil/uL (ref 3.87–5.11)
RDW: 16.8 % — ABNORMAL HIGH (ref 11.5–15.5)
WBC: 10.5 K/uL (ref 4.0–10.5)

## 2024-05-20 LAB — TSH: TSH: 0.63 u[IU]/mL (ref 0.35–5.50)

## 2024-05-20 LAB — CORTISOL: Cortisol, Plasma: 10.1 ug/dL

## 2024-05-20 LAB — VITAMIN B12: Vitamin B-12: 1203 pg/mL — ABNORMAL HIGH (ref 211–911)

## 2024-05-20 LAB — SEDIMENTATION RATE: Sed Rate: 14 mm/h (ref 0–30)

## 2024-05-20 MED ORDER — PANTOPRAZOLE SODIUM 40 MG PO TBEC: 40.0000 mg | DELAYED_RELEASE_TABLET | Freq: Two times a day (BID) | ORAL | 0 refills | Status: AC

## 2024-05-20 NOTE — Patient Instructions (Addendum)
 Your provider has requested that you go to the basement level for lab work before leaving today. Press B on the elevator. The lab is located at the first door on the left as you exit the elevator.  Your provider has requested that you have an abdominal x ray before leaving today. Please go to the basement floor to our Radiology department for the test.  Case number [id number] You have been scheduled for an endoscopy. Please follow written instructions given to you at your visit today.  If you use inhalers (even only as needed), please bring them with you on the day of your procedure.  If you take any of the following medications, they will need to be adjusted prior to your procedure:   DO NOT TAKE 7 DAYS PRIOR TO TEST- Trulicity (dulaglutide) Ozempic, Wegovy (semaglutide) Mounjaro, Zepbound (tirzepatide) Bydureon Bcise (exanatide extended release)  DO NOT TAKE 1 DAY PRIOR TO YOUR TEST Rybelsus (semaglutide) Adlyxin (lixisenatide) Victoza (liraglutide) Byetta (exanatide) ___________________________________________________________________________    VISIT SUMMARY:  During your visit, we discussed your ongoing issues with nausea, vomiting, abdominal pain, and weight loss. We also addressed your gastroesophageal reflux disease, chronic constipation with overflow diarrhea, and anemia. We have planned further evaluations and adjustments to your medications to help manage these conditions.  YOUR PLAN:  CHRONIC NAUSEA, VOMITING, ABDOMINAL PAIN, AND WEIGHT LOSS: You have been experiencing severe nausea, vomiting, abdominal pain, and weight loss for the past three months. We need to investigate further to understand the cause. -Follow a gastroparesis diet as recommended. -We have scheduled an esophagogastroduodenoscopy (EGD) to examine your upper digestive tract. -Increase your pantoprazole  to twice daily. -We will hold off on starting mirtazapine until we have more information. -An  abdominal x-ray has been ordered to check for any obstructions or constipation. -We have ordered blood tests to check your hemoglobin, iron, and vitamin B12 levels.  GASTROESOPHAGEAL REFLUX DISEASE (GERD): Your GERD symptoms have persisted despite your current medication. -Increase your pantoprazole  to twice daily.  CHRONIC CONSTIPATION WITH OVERFLOW DIARRHEA: You have been experiencing variable bowel habits, which may be contributing to your other symptoms. -An abdominal x-ray has been ordered to check for stool burden and constipation. -Follow dietary recommendations consistent with the gastroparesis diet.  ANEMIA: Your recent low hemoglobin levels may be contributing to your falls and fatigue. -We have ordered blood tests to check your hemoglobin, iron, and vitamin B12 levels.  Gastroparesis Gastroparesis is a condition in which food takes longer than normal to empty from the stomach.  This condition is also known as delayed gastric emptying. It is usually a long-term (chronic) condition.  What are the signs or symptoms? Symptoms of this condition include: Feeling full after eating very little or a loss of appetite. Nausea, vomiting, or heartburn. Bloating of your abdomen. Inconsistent blood sugar (glucose) levels on blood tests. Unexplained weight loss. Acid from the stomach coming up into the esophagus (gastroesophageal reflux). Sudden tightening (spasm) of the stomach, which can be painful. Symptoms may come and go. Some people may not notice any symptoms.  What increases the risk? You are more likely to develop this condition if: You have certain disorders or diseases. These may include: An endocrine disorder. An eating disorder. Amyloidosis. Scleroderma. Parkinson's disease. Multiple sclerosis. Cancer or infection of the stomach or the vagus nerve. You have had surgery on your stomach or vagus nerve. You take certain medicines. You are female.  Things you can  do: Please do small frequent meals like 4-6 meals a  day.  Eat and drink liquids at separate times.  Avoid high fiber foods, cook your vegetables, avoid high fat food.  Suggest spreading protein throughout the day (greek yogurt, glucerna, soft meat, milk, eggs) Choose soft foods that you can mash with a fork When you are more symptomatic, change to pureed foods foods and liquids.  Consider reading Living well with Gastroparesis by [facility] Check out this link to a diet online [email or web address].groupjournal.fr

## 2024-05-20 NOTE — Progress Notes (Signed)
 "    05/20/2024 Julie Ballard [id number] 10/03/1945  Referring provider: Lazoff, Shawn P, DO Primary GI doctor: Dr. Charlanne  ASSESSMENT AND PLAN:  Nausea with vomiting x 3 months, history of GERD, history of vertigo/falls, smell can worsen symptoms as well, can have brown in the vomit Status post cholecystectomy On Protonix  40 mg ER visit 01/27/2024 with upper abdominal pain nausea reflux intermittent vomiting, elevated troponins found to be in acute renal failure elevated white blood cell count normal lipase. ER visit 04/22/2024 for chronic vomiting hemoglobin 12, MCV 82.5, platelets 316, lipase 183, creatinine 1.24, normal liver 04/22/2024 CT unremarkable other than mild to moderate bilateral Nephrotic stranding nonspecific urine normal On pantoprazole  40 mg once a day, some GERD, reglan  has not helped - increase protonix  40 mg BID - schedule EGD, may be better at the hospital with recent falls and comorbid conditions, I discussed risks of EGD with patient today, including risk of sedation, bleeding or perforation.  Patient provides understanding and gave verbal consent to proceed. 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  Patient understands that there is a low but real risk of cardiovascular event such as heart attack, stroke, or embolism /  thrombosis, or ischemia while off Eliquis  The patient consents to proceed.  Will communicate by phone or EMR with patient's prescribing provider to confirm that holding Eliquis  is reasonable in this case.   Stage IIIa CKD BUN 16 Cr 1.70  GFR 30  Potassium 4.7    Anemia 01/27/2024  HGB 14.6 MCV 81.3 Platelets 267 04/21/2024 HGB 12, HCT 35.5, MCV 82.5 Recent Labs    01/27/24 1356 01/27/24 1417  HGB 13.7 14.6  Check B12, iron, ferritin  Alternating diarrhea/constipation, history of IBS, history of sigmoidectomy Colonoscopy with Dr. Charlanne in [facility] 04/2016 TA polyps, grade 3 hemorrhoids) -KUB to evaluate for stool burden/obstruction, I think this is more stool burden/pelvic floor  -ESR to rule out inflammation - TSH  -TTG/IGA to evaluate for celiac disease.  -  Bentyl as needed -Add on citracel/benefiber, FODMAP, trial off lactulose and lifestyle changes discussed - consider colonoscopy -Consider SIBO testing or xifaxin trial pending results  Patient Care Team: Lazoff, Shawn P, DO as PCP - General (Family Medicine)  HISTORY OF PRESENT ILLNESS: 78 y.o. female with a past medical history listed below presents for evaluation of nausea, vomiting, weight loss.   Appears patient was remotely seen by Dr. Donnald in 1999  Discussed the use of AI scribe software for clinical note transcription with the patient, who gave verbal consent to proceed.  History of Present Illness   Julie Ballard is a 78 year old female with stage 3B chronic kidney disease and prior stroke who presents with three months of chronic nausea, vomiting, abdominal pain, and weight loss.  For three months, she has experienced persistent abdominal pain, nausea, and vomiting. Abdominal pain began in the lower abdomen and has migrated to the upper left quadrant, described as deep and constant, without clear relation to meals. Severe nausea occurs especially with food in  her stomach, leading to vomiting at least three days per week. Some days she tolerates food normally, while on others she cannot tolerate food or its smell. Early satiety and rapid fullness are present. Weight loss of  approximately 10-15 pounds has occurred over this period. Emesis on Christmas Day was described as brown and yellow liquid. No prior similar episodes before this period. No episodes of dark or bloody stools. No improvement with metoclopramide . Multiple hospital visits and two CT scans (September and December 2025) have not revealed significant findings.  Bowel habits have changed since a heart ablation last year, with daily loose stools, sometimes liquid, and occasional days without bowel movements. Episodes of cramping and large-volume stools occur even after not eating for several days. She denies food or pills getting stuck in her throat or chest. No fevers or chills, except for one episode where she was told she had a fever. Abdominal pain is described as growling, particularly on the left side.  She has longstanding acid reflux and takes pantoprazole  daily, which is being increased. Current medications include carvedilol, valsartan, Eliquis, sertraline, oxybutynin, and vitamin D. Mirtazapine was recently prescribed but not started. Several medications have been discontinued due to frequent falls. Lightheadedness and dizziness occur with standing or movement, sometimes resulting in falls. Residual deficits from a stroke in 2022 persist.      She  reports history of smoking, quit 2022. She has a 10 pack-year smoking history. She has never used smokeless tobacco. She reports that she does not drink alcohol and does not use drugs.  RELEVANT GI HISTORY, IMAGING AND LABS: Results   Labs Hemoglobin: Low  Radiology Abdominal CT (04/22/2024): No acute abnormalities; no evidence of pancreatitis. No acute abnormalities on prior study in 01/2024.      CBC    Component Value Date/Time   WBC 11.0 (H) 01/27/2024 1356   RBC 5.12 (H) 01/27/2024 1356   HGB 14.6 01/27/2024 1417   HCT 43.0 01/27/2024 1417   PLT 267 01/27/2024 1356   MCV 81.3 01/27/2024 1356   MCH 26.8 01/27/2024 1356   MCHC 32.9  01/27/2024 1356   RDW 13.8 01/27/2024 1356   LYMPHSABS 1.7 03/29/2021 2152   MONOABS 0.7 03/29/2021 2152   EOSABS 0.5 03/29/2021 2152   BASOSABS 0.1 03/29/2021 2152   Recent Labs    01/27/24 1356 01/27/24 1417  HGB 13.7 14.6    CMP     Component Value Date/Time   NA 133 (L) 01/27/2024 1417   K 4.7 01/27/2024 1417   CL 100 01/27/2024 1417   CO2 18 (L) 01/27/2024 1356   GLUCOSE 188 (H) 01/27/2024 1417   BUN 16 01/27/2024 1417   CREATININE 1.70 (H) 01/27/2024 1417   CALCIUM 9.7 01/27/2024 1356   PROT 7.4 01/27/2024 1356   ALBUMIN 4.2 01/27/2024 1356   AST 37 01/27/2024 1356   ALT 19 01/27/2024 1356   ALKPHOS 77 01/27/2024 1356   BILITOT 1.3 (H) 01/27/2024 1356   GFRNONAA 30 (L) 01/27/2024 1356   GFRAA >60 08/19/2016 1427      Latest Ref Rng & Units 01/27/2024    1:56 PM 03/29/2021    9:52 PM  Hepatic Function  Total Protein 6.5 - 8.1 g/dL 7.4  6.4   Albumin 3.5 - 5.0 g/dL 4.2  3.6   AST 15 - 41 U/L 37  23   ALT 0 - 44 U/L 19  15   Alk Phosphatase 38 - 126 U/L 77  57   Total  Bilirubin 0.0 - 1.2 mg/dL 1.3  0.8       Current Medications:   Current Outpatient Medications (Endocrine & Metabolic):    levothyroxine (SYNTHROID, LEVOTHROID) 50 MCG tablet, Take 50 mcg by mouth daily before breakfast.    estrogens-methylTEST (ESTRATEST) 1.25-2.5 MG tablet, Take 1 tablet by mouth daily. (Patient not taking: Reported on 05/20/2024)  Current Outpatient Medications (Cardiovascular):    carvedilol (COREG) 25 MG tablet, Take 25 mg by mouth 2 (two) times daily.   hydrochlorothiazide 25 MG tablet, Take 25 mg by mouth 3 (three) times daily.  (Patient not taking: Reported on 05/20/2024)   nitroGLYCERIN (NITROSTAT) 0.4 MG SL tablet, Place 0.4 mg under the tongue every 5 (five) minutes as needed for chest pain. (Patient not taking: Reported on 05/20/2024)   simvastatin (ZOCOR) 40 MG tablet, Take 40 mg by mouth daily. (Patient not taking: Reported on 05/20/2024)   valsartan (DIOVAN) 80  MG tablet, Take 80 mg by mouth daily.  Current Outpatient Medications (Respiratory):    promethazine (PHENERGAN) 25 MG tablet, Take 25 mg by mouth every 6 (six) hours as needed (TAKES W/ DEMEROL ).   Current Outpatient Medications (Analgesics):    meperidine  (DEMEROL ) 50 MG tablet, Take 50 mg by mouth every 4 (four) hours as needed for moderate pain.  (Patient not taking: Reported on 05/20/2024)   meperidine  (DEMEROL ) 50 MG/ML injection, Inject 1 mL (50 mg total) into the muscle every 4 (four) hours as needed for moderate pain. (Patient not taking: Reported on 05/20/2024)  Facility-Administered Medications Ordered in Other Visits (Analgesics):    bupivacaine  (MARCAINE ) 0.5 % 15 mL, phenazopyridine  (PYRIDIUM ) 400 mg bladder mixture*  Current Outpatient Medications (Hematological):    vitamin B-12 (CYANOCOBALAMIN) 1000 MCG tablet, Take 1,000 mcg by mouth daily.    ELIQUIS 5 MG TABS tablet, Take 5 mg by mouth 2 (two) times daily.  Current Outpatient Medications (Other):    dicyclomine (BENTYL) 10 MG capsule, Take 10 mg by mouth every 4 (four) hours as needed for spasms.    LORazepam (ATIVAN) 1 MG tablet, Take 1 mg by mouth See admin instructions. Take 1 mg by mouth up to 5 times daily as needed for tremors   metoCLOPramide  (REGLAN ) 10 MG tablet, Take 1 tablet (10 mg total) by mouth every 6 (six) hours as needed for nausea, vomiting or refractory nausea / vomiting (nausea/headache).   Multiple Vitamins-Minerals (MULTIVITAMIN PO), Take 1 tablet by mouth daily.   potassium chloride SA (K-DUR,KLOR-CON) 20 MEQ tablet, Take 20 mEq by mouth daily.   sertraline (ZOLOFT) 50 MG tablet, Take 50 mg by mouth daily.   cyclobenzaprine (FLEXERIL) 10 MG tablet, Take 10 mg by mouth 3 (three) times daily as needed for muscle spasms.    ergocalciferol (VITAMIN D2) 50000 UNITS capsule, Take 50,000 Units by mouth every 14 (fourteen) days.    fluocinonide (LIDEX) 0.05 % external solution, Apply 1 application topically  daily. (Patient not taking: Reported on 05/20/2024)   gabapentin (NEURONTIN) 300 MG capsule, Take 300-600 mg by mouth See admin instructions. Take 300 mg by mouth twice daily and take 600 mg by mouth at bedtime (Patient not taking: Reported on 05/20/2024)   ketoconazole (NIZORAL) 2 % shampoo, Apply 1 application topically every 3 (three) days. (Patient not taking: Reported on 05/20/2024)   oxybutynin (DITROPAN-XL) 10 MG 24 hr tablet, Take 10 mg by mouth daily.   pantoprazole  (PROTONIX ) 40 MG tablet, Take 1 tablet (40 mg total) by mouth 2 (two) times daily before a meal.  phenazopyridine  (PYRIDIUM ) 200 MG tablet, Take 1 tablet (200 mg total) by mouth 3 (three) times daily as needed for pain. (Patient not taking: Reported on 05/20/2024)   tolterodine (DETROL LA) 4 MG 24 hr capsule, Take 4 mg by mouth daily.  (Patient not taking: Reported on 05/20/2024)   zolpidem (AMBIEN) 10 MG tablet, Take 10 mg by mouth at bedtime.  (Patient not taking: Reported on 05/20/2024)  Facility-Administered Medications Ordered in Other Visits (Other):    bupivacaine  (MARCAINE ) 0.5 % 15 mL, phenazopyridine  (PYRIDIUM ) 400 mg bladder mixture*  * These medications belong to multiple therapeutic classes and are listed under each applicable group.  No current facility-administered medications for this visit.  Medical History:  Past Medical History:  Diagnosis Date   Anxiety    Chronic pain    Complication of anesthesia SEVERE BRADYCARDIA WHEN TAKES BETA BLOCKERS PRIOR TO SURGERY   ANES. RECORD REQUESTED FROM 2004 [facility])   DDD (degenerative disc disease)    Depression    DJD (degenerative joint disease)    Fibromyalgia    SEVERE   Frequency of urination    GAD (generalized anxiety disorder)    GERD (gastroesophageal reflux disease)    History of atrial fibrillation    History of CHF (congestive heart failure)    CARDIOLOGIST ---  DR JORDAN   History of diverticulitis of colon    History of seizures     GESTATIONAL SEIZURES--  NONE SINCE   History of syncope    Hyperlipidemia    Hypertension    Hypothyroidism (acquired)    IBS (irritable bowel syndrome)    IBS (irritable bowel syndrome)    Insomnia    Low back pain    Nocturia    Thyroid cyst    Tremor    Tremor, essential    TAKES ATIVAN   Vitamin D deficiency    Allergies: Allergies[1]   Surgical History:  She  has a past surgical history that includes Esophagogastroduodenoscopy (10/05/2010); Sigmoid resection / rectopexy (05/23/1991); Hemorroidectomy (LAST ONE 1995); Cardiovascular stress test (12-22-2010  DR JORDAN); Vaginal hysterectomy (AGE 66); Bilateral salpingoophorectomy (05/22/2002); Tonsillectomy and adenoidectomy (AGE 82); Cholecystectomy (05/22/2002); Cardiac catheterization (09/2000  FORSYTHE); CYSTO/ HOD/ INSTILLATION THERAPY (1999  &  2004); cysto with hydrodistension (N/A, 09/17/2012); Colonoscopy (05/08/2016); Esophagogastroduodenoscopy (04/20/2016); Tonsillectomy and adenoidectomy; and heart ablation (N/A). Family History:  Her family history includes Alcohol abuse in her sister; Cirrhosis in her sister; Heart attack in her brother and father.  REVIEW OF SYSTEMS  : All other systems reviewed and negative except where noted in the History of Present Illness.  PHYSICAL EXAM: BP 122/62   Pulse 61   Ht 5' 6 (1.676 m)   Wt 139 lb 2 oz (63.1 kg)   BMI 22.46 kg/m  Physical Exam   GENERAL APPEARANCE: Well nourished, in no apparent distress. HEENT: No cervical lymphadenopathy, unremarkable thyroid, sclerae anicteric, conjunctiva pink. RESPIRATORY: Respiratory effort normal, breath sounds equal bilaterally without rales, rhonchi, or wheezing. CARDIO: Regular rate and rhythm with no murmurs, rubs, or gallops, peripheral pulses intact. ABDOMEN: Soft, non-distended, active bowel sounds in all four quadrants, no tenderness to palpation, no rebound, no mass appreciated. RECTAL: Declines. MUSCULOSKELETAL: Full range of  motion, normal gait, without edema. SKIN: Dry, intact without rashes or lesions. No jaundice. NEURO: Alert, oriented, no focal deficits. PSYCH: Cooperative, normal mood and affect.      Alan JONELLE Coombs, PA-C 3:16 PM      [1]  Allergies Allergen Reactions   Sulfa  Drugs Cross Reactors Hives, Swelling and Other (See Comments)    SEVERE   Abilify [Aripiprazole] Other (See Comments)    Unknown   Biaxin [Clarithromycin] Other (See Comments)    Unknown   Codeine Hives and Swelling   Cymbalta [Duloxetine Hcl] Nausea And Vomiting and Other (See Comments)    DILEARIUS   Doxycycline Hives and Swelling   Elavil [Amitriptyline Hcl] Other (See Comments)    MAKES ME GO CRAZY   Erythromycin Hives and Swelling   Halcion [Triazolam] Other (See Comments)    Unknown   Hydrocodone Other (See Comments)    Unknown   Hydromorphone  Nausea And Vomiting   Lamisil [Terbinafine] Other (See Comments)    Unknown   Lipitor [Atorvastatin] Other (See Comments)    Unknown, upset stomach   Morphine  And Codeine Nausea And Vomiting and Other (See Comments)    SEVERE   Mysoline [Primidone] Other (See Comments)    MAKES ME GO CRAZY   Other Other (See Comments)    Flu Vaccine- Sick for 3 months   Penicillins Hives and Swelling   Pravachol [Pravastatin] Other (See Comments)    Unknown   Pseudoephedrine Hcl Other (See Comments)    Unknown   Vytorin [Ezetimibe-Simvastatin] Other (See Comments)    Unknown   Zofran  Nausea And Vomiting and Other (See Comments)    SEVERE   Zofran  [Ondansetron  Hcl] Other (See Comments)    Unknown   Adhesive [Tape] Rash   "

## 2024-05-21 LAB — IGA: Immunoglobulin A: 97 mg/dL (ref 70–320)

## 2024-05-21 LAB — TISSUE TRANSGLUTAMINASE, IGA: (tTG) Ab, IgA: <1 U/mL

## 2024-05-23 ENCOUNTER — Ambulatory Visit: Payer: Self-pay | Admitting: Physician Assistant

## 2024-05-23 ENCOUNTER — Encounter: Payer: Self-pay | Admitting: Physician Assistant

## 2024-05-23 DIAGNOSIS — E876 Hypokalemia: Secondary | ICD-10-CM

## 2024-06-12 NOTE — Progress Notes (Addendum)
 [facility]  History    Julie Ballard is a 79 year old woman with hypertension, history of CVA, atrial fibrillation, status post PVI ablation 2023, history of atrial tachycardia and atypical a flutter, status post ablations in 2023.  Patient was on amiodarone in the past but had induced hypothyroidism and was discontinued.  TSH levels normalized.  This my first time seeing patient and it was a pleasure.  She is here with her husband Ron.  Patient reports she has been experiencing nausea, vomiting and diarrhea for several months.  She is scheduled to get a endoscopy for further evaluation of her symptoms.  The service is asking for cardiovascular evaluation..   She has no cardiovascular or electrophysiology issues or complaints.  Denies palpitations, feeling her heart race, irregular heartbeat, has had no issues with bleeding or bruising and no recent falls.  Reports home blood pressures are range between 130-135 over 80s. Maintains medication compliance on carvedilol.  Review of daily labs are within normal limits including magnesium, potassium and TSH.  Review of systems: She denies chest pain, dyspnea, fatigue, and syncope.  Pertinent systems reviewed and detailed as noted in the HPI. All other systems reviewed and no abnormalities noted. I reviewed her current medications and laboratory studies. I independently reviewed her EKG and pertinent medical records.   Reviewed and updated this visit by provider: Tobacco  Allergies  Meds  Problems  Med Hx  Surg Hx  Fam Hx         Objective    Vitals:   06/12/24 1403 06/12/24 1411  BP: (!) 168/70 (!) 152/70  BP Location: Right Lower Arm Right Lower Arm  Patient Position: Sitting Sitting  Pulse: 59   SpO2: 96%   Weight: 136 lb 8 oz (61.9 kg)   Height: 5' 6 (1.676 m)    EKG: Sinus rhythm with PVCs, QTc 493 ms. -will review EKG/QTc with Dr. Janan  General:  Alert, cooperative, no distress, appears stated age   Neck: Supple, symmetrical, trachea midline, no adenopathy; no thyroid  enlargement; normal JVP; no carotid bruit  Lungs:   Clear to auscultation bilaterally; good chest expansion  Chest wall:  No tenderness or deformity   Cardiac:  Normal rate with irregular rhythm; S1 and S2 normal; no murmurs; non-displaced PMI  Abdomen:   Soft, non-tender, non-distended  Extremities: 2+ radial pulses bilaterally; no edema       Assessment and Plan     PAF No reoccurrence since ablation Continue anticoagulation at this time  Prolonged QTc Review of serial EKGs QTc has been prolonged as much as 488 ms and today 493 ms.  EKG with each visit.  Potential offending medications: for QTc prolongation: Zoloft, ativan, hydroxyzine and phenergan  Has no electrolyte imbalances.  Will review with Dr. Janan for advice/recommendations for QTc review.   Hx of atypical flutter and atrial tachycardia No reoccurrence s/p catheter ablation and two focal ATs   PVCs Asymptomatic, continue to monitor  HTN Home BPs well controlled  Advised to begin check BP regularly  Hx of CVA    Electronically signed by:  Francesco Corean Caldron, NP  Addendum:  Reviewed QTc with Dr. Janan and provided input that sertraline, promethazine, and hydroxyzine can increase the QT interval. It's not high enough to warrant intervention, but should be cautious with the use of these medicines and not add further QT-prolonging drugs.   Will update patient on the recommendations.   Corean DELENA Francesco, NP 06/19/2024 / 9:35 AM

## 2024-06-13 ENCOUNTER — Encounter (HOSPITAL_COMMUNITY): Payer: Self-pay | Admitting: Gastroenterology

## 2024-06-13 NOTE — Progress Notes (Signed)
 Attempted to obtain medical history for pre op call via telephone, unable to reach at this time. HIPAA compliant voicemail message left requesting return call to pre surgical testing department.

## 2024-06-16 ENCOUNTER — Ambulatory Visit: Admitting: Physician Assistant

## 2024-06-18 ENCOUNTER — Telehealth: Payer: Self-pay | Admitting: Gastroenterology

## 2024-06-18 ENCOUNTER — Telehealth: Payer: Self-pay

## 2024-06-18 NOTE — Telephone Encounter (Signed)
 Questions answered by husband no concerns already talked to the hospital

## 2024-06-18 NOTE — Telephone Encounter (Signed)
 Patient will hold Eliquis 48 hours prior to procedure and resume once no bleeding risk per Corean Savannah NP  LVM  Paper sent to be scanned

## 2024-06-18 NOTE — Telephone Encounter (Addendum)
 Procedure:Endoscopy Procedure date: 06/26/24 Procedure location: [facility] Arrival Time: 6:30 am Spoke with the patient Y/N:   No, I left a detailed message on [phone] on 06/18/24 @ 1:36 pm for the patient to return call   Any prep concerns? No  Has the patient obtained the prep from the pharmacy ? No prep needed Do you have a care partner and transportation: ___ Any additional concerns? ___

## 2024-06-18 NOTE — Telephone Encounter (Signed)
 Patient's husband made aware to stop Eliquis 2 days prior so no Eliquis starting 06-24-24 and he voiced understanding. [place] he already spoke to the hospital yesterday

## 2024-06-25 ENCOUNTER — Telehealth (HOSPITAL_COMMUNITY): Payer: Self-pay | Admitting: Emergency Medicine

## 2024-06-25 ENCOUNTER — Telehealth: Payer: Self-pay | Admitting: Gastroenterology

## 2024-06-25 NOTE — Telephone Encounter (Signed)
 Julie Ballard was scheduled for EGD (Procedure) with Dr. Charlanne on 06/26/24 (date), at [facility].   Patient/or family called on 06/25/24 (date) to cancel their procedure due to transportation/weather issues (reason)   Patient instructed to call physician's office to reschedule their procedure. Patient demonstrated understanding.

## 2024-06-25 NOTE — Telephone Encounter (Signed)
 The following patient is scheduled for a colonoscopy on tomorrow and had to cancel because he car is still stuck in their yard and she is unable to get out of the yard without sliding on ice.

## 2024-06-25 NOTE — Telephone Encounter (Signed)
 EGD has been canceled. Pt husband Tanda made aware.  Pt to be place on wait list. Tanda made aware.  Tanda verbalized understanding with all questions answered.

## 2024-06-25 NOTE — Telephone Encounter (Signed)
 Noted she was put on waitlist for hopefully April

## 2024-06-26 ENCOUNTER — Encounter (HOSPITAL_COMMUNITY): Payer: Self-pay | Admitting: Anesthesiology

## 2024-06-26 ENCOUNTER — Encounter (HOSPITAL_COMMUNITY): Admission: RE | Payer: Self-pay | Source: Home / Self Care

## 2024-06-26 ENCOUNTER — Ambulatory Visit (HOSPITAL_COMMUNITY): Admission: RE | Admit: 2024-06-26 | Source: Home / Self Care | Admitting: Gastroenterology

## 2024-08-21 ENCOUNTER — Ambulatory Visit: Admitting: Diagnostic Neuroimaging

## 2024-08-26 ENCOUNTER — Ambulatory Visit: Admitting: Diagnostic Neuroimaging
